# Patient Record
Sex: Male | Born: 1960 | Race: White | Hispanic: No | Marital: Married | Smoking: Never smoker
Health system: Southern US, Academic
[De-identification: ages and names within clinical notes are randomized; demographics above are authoritative.]

## PROBLEM LIST (undated history)

## (undated) DIAGNOSIS — J45909 Unspecified asthma, uncomplicated: Secondary | ICD-10-CM

## (undated) DIAGNOSIS — Z9289 Personal history of other medical treatment: Secondary | ICD-10-CM

## (undated) DIAGNOSIS — I10 Essential (primary) hypertension: Secondary | ICD-10-CM

## (undated) DIAGNOSIS — E119 Type 2 diabetes mellitus without complications: Secondary | ICD-10-CM

## (undated) HISTORY — PX: HX COLONOSCOPY: 2100001147

## (undated) HISTORY — PX: HX BACK SURGERY: SHX140

## (undated) HISTORY — PX: RENAL BIOPSY, OPEN: SUR143

## (undated) HISTORY — PX: LIMBAL STEM CELL TRANSPLANT: SHX1969

## (undated) HISTORY — DX: Essential (primary) hypertension: I10

## (undated) HISTORY — DX: Unspecified asthma, uncomplicated: J45.909

## (undated) HISTORY — DX: Type 2 diabetes mellitus without complications: E11.9

## (undated) HISTORY — DX: Personal history of other medical treatment: Z92.89

---

## 1995-11-14 ENCOUNTER — Emergency Department (HOSPITAL_COMMUNITY): Payer: Self-pay

## 2019-10-26 IMAGING — MR MRI LUMBAR SPINE WITHOUT CONTRAST
6 series · 48 of 48 positions shown · IV contrast (gadolinium)
Comparison: None.

EXAM:  MRI LUMBAR SPINE WITHOUT CONTRAST
INDICATION: Pain in lower back.
TECHNIQUE: Multiplanar, multisequential MRI of the lumbar spine was performed without gadolinium contrast.

[Series 6: T2 · sagittal · 5.0mm · 1.00mm/px · 7 of 15 slices shown (1 of 3)]
[im 1/15]
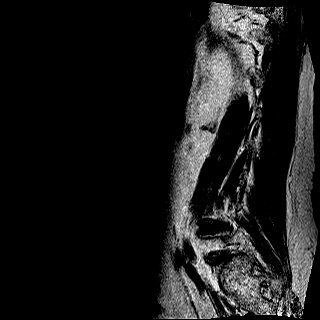
[im 3/15]
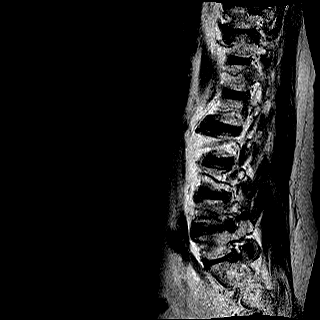
[im 5/15]
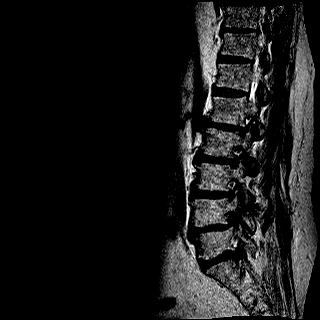
[im 8/15]
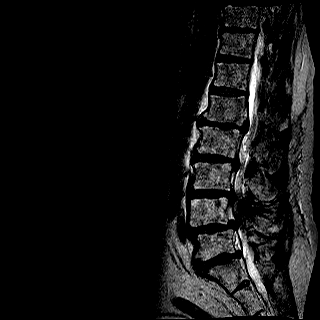
[im 10/15]
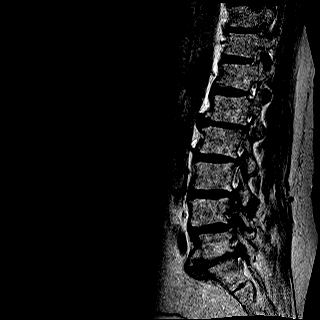
[im 12/15]
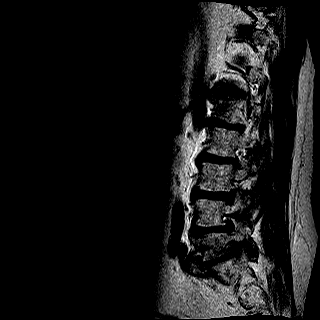
[im 15/15]
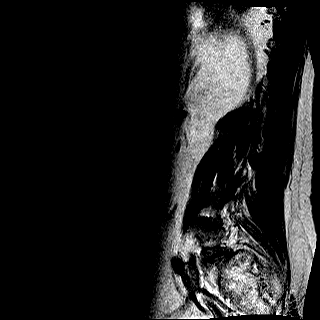

[Series 7: T1 · sagittal · 5.0mm · 1.00mm/px · 7 of 15 slices shown (1 of 2)]
[im 1/15]
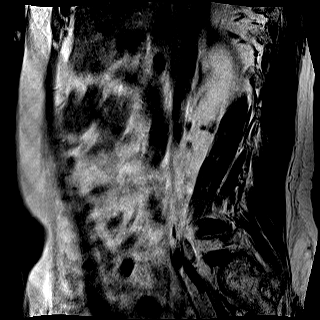
[im 3/15]
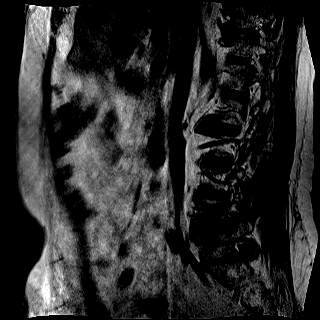
[im 5/15]
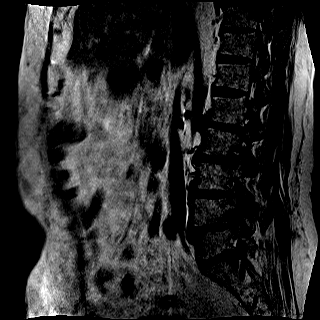
[im 8/15]
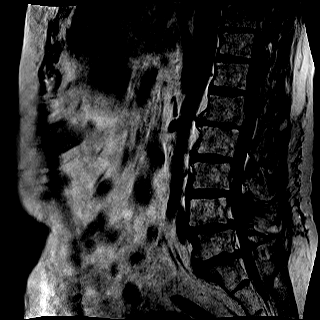
[im 10/15]
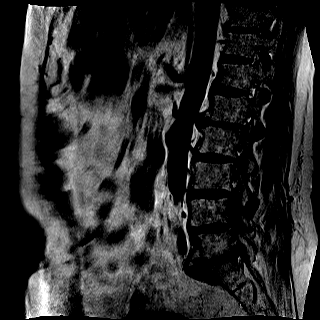
[im 12/15]
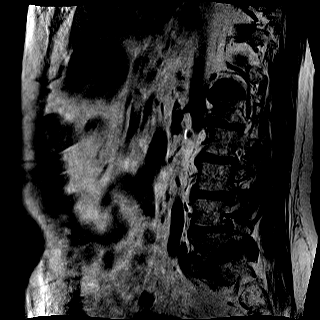
[im 15/15]
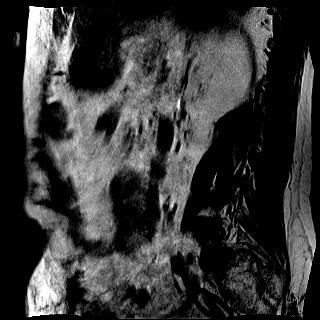

[Series 10: STIR · sagittal · 5.0mm · 1.25mm/px · 6 of 15 slices shown]
[im 1/15]
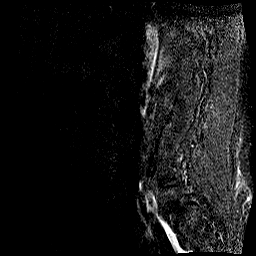
[im 3/15]
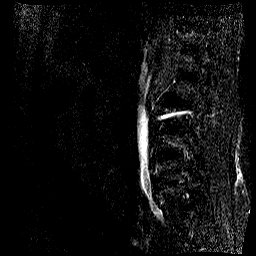
[im 6/15]
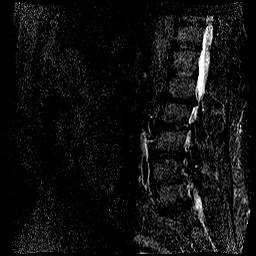
[im 9/15]
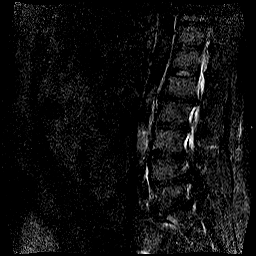
[im 12/15]
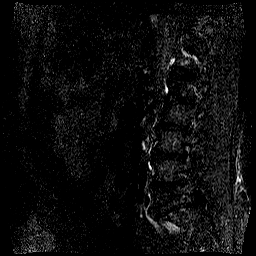
[im 15/15]
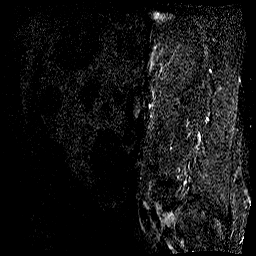

[Series 11: T2 · axial · 5.0mm · 0.89mm/px · z∈[-124,+61]mm · 10 of 25 slices shown (2 of 3)]
[im 1/25]
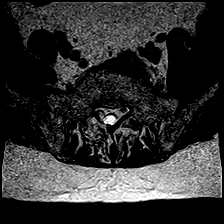
[im 3/25]
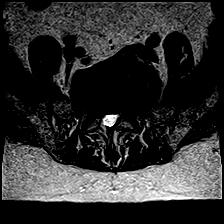
[im 6/25]
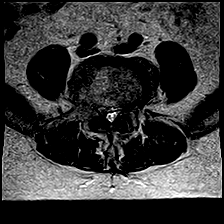
[im 9/25]
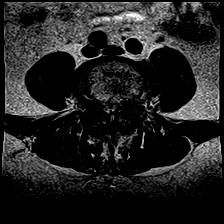
[im 11/25]
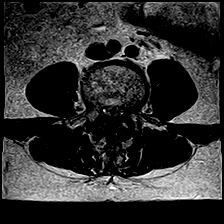
[im 14/25]
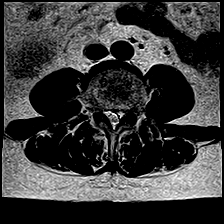
[im 17/25]
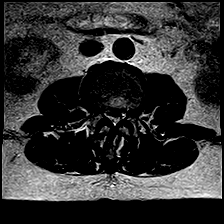
[im 19/25]
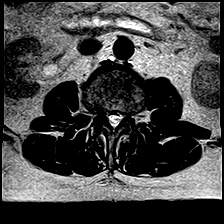
[im 22/25]
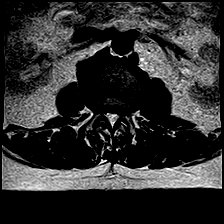
[im 25/25]
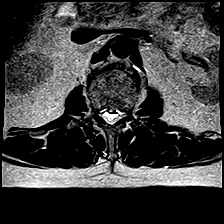

[Series 12: T1 · axial · 5.0mm · 0.89mm/px · z∈[-124,+61]mm · 10 of 25 slices shown (2 of 2)]
[im 1/25]
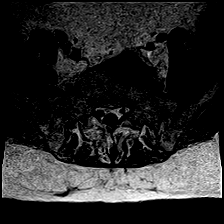
[im 3/25]
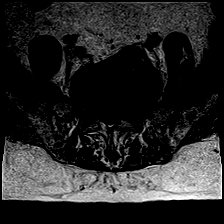
[im 6/25]
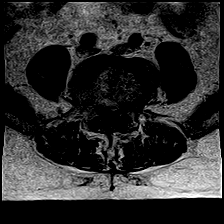
[im 9/25]
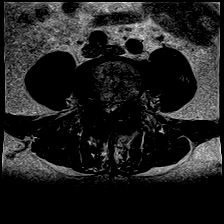
[im 11/25]
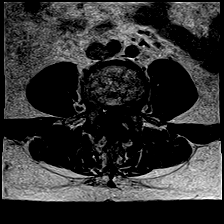
[im 14/25]
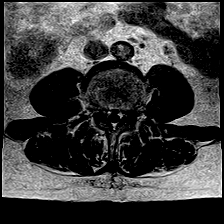
[im 17/25]
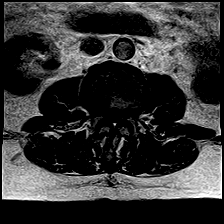
[im 19/25]
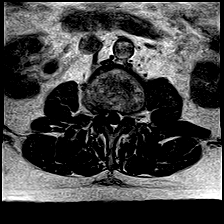
[im 22/25]
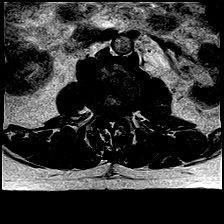
[im 25/25]
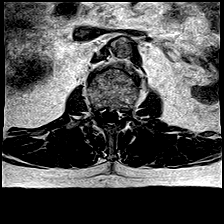

[Series 14: T2 · coronal · 4.0mm · 1.34mm/px · 8 of 20 slices shown (3 of 3)]
[im 1/20]
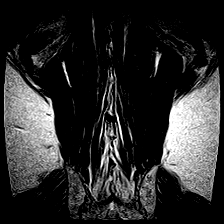
[im 3/20]
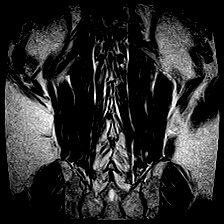
[im 6/20]
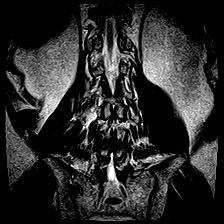
[im 9/20]
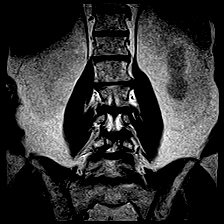
[im 11/20]
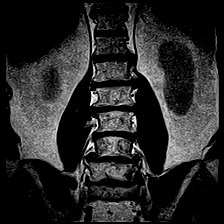
[im 14/20]
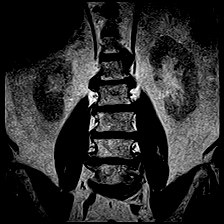
[im 17/20]
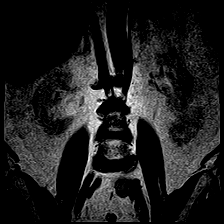
[im 20/20]
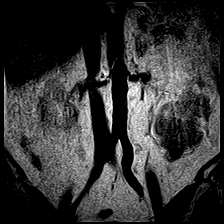

[48 of 48 positions shown; findings below may reference images not displayed]

FINDINGS: There is no acute bone marrow edema.  No compression fractures are seen.  No subluxation noted.  Conus terminates at T12-L1.  

At L1-L2, broad-based disc osteophyte complex noted causing severe central canal and bilateral foraminal stenosis.  

At L2-L3, significant broad-based disc osteophyte complex and ligamentum flavum hypertrophy noted causing severe central canal and bilateral foraminal stenosis, AP diameter of 4 mm.  

At L3-L4, significant broad-based disc osteophyte complex and ligamentum flavum hypertrophy noted causing critical 3 mm central canal stenosis and severe bilateral foraminal stenosis. 

At L4-L5, broad-based disc osteophyte complex noted causing critically severe central and bilateral foraminal stenosis with AP diameter of 3 mm.  

At L5-S1, broad-based disc osteophyte complex noted causing severe central canal and bilateral foraminal stenosis.  

Paravertebral soft tissues otherwise unremarkable.
IMPRESSION: No acute bone marrow edema, compression fractures, or subluxation.

Severe multilevel critical central canal stenosis secondary to multilevel disc osteophyte complexes as described above, worst at L3-L4 and L4-L5.

## 2021-09-10 ENCOUNTER — Ambulatory Visit (INDEPENDENT_AMBULATORY_CARE_PROVIDER_SITE_OTHER): Payer: Medicare Other | Admitting: Hematology & Oncology

## 2021-09-10 ENCOUNTER — Encounter (INDEPENDENT_AMBULATORY_CARE_PROVIDER_SITE_OTHER): Payer: Self-pay | Admitting: Hematology & Oncology

## 2021-09-10 ENCOUNTER — Other Ambulatory Visit: Payer: Self-pay

## 2021-09-10 VITALS — BP 147/79 | HR 93 | Temp 98.8°F | Ht 71.0 in | Wt 240.0 lb

## 2021-09-10 DIAGNOSIS — C9000 Multiple myeloma not having achieved remission: Secondary | ICD-10-CM

## 2021-09-10 NOTE — Progress Notes (Signed)
Department of Hematology/Oncology  History and Physical    Name: Connor Patterson  ZYY:[id number]  Date of Birth: 1961/07/27  Encounter Date: 09/10/2021    REFERRING PROVIDER:  Loran Senters, MD  [address]  [place],  [place] [id number]    REASON FOR OFFICE VISIT:  New patient for evaluation and management of  Multiple myeloma.    HISTORY OF PRESENT ILLNESS:  Connor Patterson is a 60 y.o. male who presents today for multiple myeloma.diagnosred  January 2004,,patient went to the emergency room at that time with respiratory infection and found to have acute renal failure with creatinine of 4.1 ,serum protein electrophoresis was showing 0.1 g monoclonal protein ,urine protein electrophoresis revealed monoclonal free lambda light chain ,total protein 3.1 g /24 hours, bone marrow aspiration biopsy was consistent with plasma cell neoplasm with normal cytogenetics ,skeletal survey was negative for lytic lesions s,erum calcium was 9.4 ,beta 2 microglobulin was elevated at 6 ,quantitative immunoglobulin was abnormal with IgG 365 IgA 14 , patient got treatment with  VAD treatment for 4 cycles then 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 using Cytoxan 1.5 grams/meters sq every 3 hours x3 doses and preparative  regimen was melphalan 100 mg per m2 for two doses,.and post treatment course day +60 posttransplant evaluation on 19 September 2003- bone marrow biopsy aspiration showed normal cellular marrow with no evidence of multiple myeloma no M spike and the patient was put on PO Thalidomide,Patient was  found to have renal artery stenosis and had exploratory laparotomy with bilateral renal vein aortorenal  bypass with reverse saphenous vein for severe hypertension and renal insufficiency ,Thalidomide was  discontinued along with Aredia followed without any myeloma, Osteonecrosis of jaw diagnosed 2013 , posttransplant treatment - Thalidomide 50 mg daily until October 2010 ,surveillance   October 2010 till October 2016, Revlimid 15 mg day 1-21 each 28 day ,cycle began October 2016 due to slowly rising free light chain ,Revlimid was held 2017 due to poor tolerance ,resumed at 5 mg and it was discontinued October 2017 for poor tolerance and serological studies showed persistent remission at that time,Ixazomib  was used for maintenance therapy while in remission,started on 12 thy  March 2018,while in remission ,continued with no evidence of recurrence or relapse disease, a restaging January 2021 -myeloma laboratories studies showed persistent remission ,January,27 th, 2021 ,marrow showed 5-10% polytypic plasma cells  and Fish -no myeloma associated abnormality detected ,MRD negative, PET-CT February 2021-no official interpretation ,and Left mandible lesions consistent with osteonecrosis.  Since his last visit patient was diagnosed with TIA in late 2021 and was on statin stopped after shoulder pain, patient also got the COVID May 10, 2021 and was put on Malnupiravir,    ROS:  Asthma,hypertension diabetes multiple myeloma, history of transfusion.,  Review of Systems - Oncology ,,    History:  Past Medical History:   Diagnosis Date   . Asthma    . Diabetes mellitus, type 2 (CMS HCC)    . Essential hypertension    . Hx of transfusion            Past Surgical History:   Procedure Laterality Date   . HX BACK SURGERY      3 times   . HX COLONOSCOPY     . LIMBAL STEM CELL TRANSPLANT     . RENAL BIOPSY, OPEN             Social History     Socioeconomic History   .  Marital status: Married     Spouse name: Not on file   . Number of children: Not on file   . Years of education: Not on file   . Highest education level: Not on file   Occupational History   . Not on file   Tobacco Use   . Smoking status: Never   . Smokeless tobacco: Former   Media planner   . Vaping Use: Never used   Substance and Sexual Activity   . Alcohol use: Not on file   . Drug use: Never   . Sexual activity: Not on file   Other Topics Concern   .  Not on file   Social History Narrative   . Not on file     Social Determinants of Health     Financial Resource Strain: Not on file   Food Insecurity: Not on file   Transportation Needs: Not on file   Physical Activity: Not on file   Stress: Not on file   Intimate Partner Violence: Not on file   Housing Stability: Not on file       Social History     Social History Narrative   . Not on file       Social History     Substance and Sexual Activity   Drug Use Never       Family Medical History:     Problem Relation (Age of Onset)    Autoimmune disease Mother    Carotid Stenosis Mother    Hodgkin's lymphoma Father            Current Outpatient Medications   Medication Sig   . albuterol sulfate (PROVENTIL) 2.5 mg /3 mL (0.083 %) Inhalation nebulizer solution Take 3 mL (2.5 mg total) by inhalation   . allopurinoL (ZYLOPRIM) 300 mg Oral Tablet    . amoxicillin (AMOXIL) 500 mg Oral Capsule Take 1 capsule by mouth three times daily for 10 days as needed for infection of jaw cavity.   Marland Kitchen ascorbic acid, vitamin C, (VITAMIN C) 500 mg Oral Tablet Take 1 Tablet (500 mg total) by mouth   . aspirin (ECOTRIN) 325 mg Oral Tablet, Delayed Release (E.C.) Take 1 Tablet (325 mg total) by mouth   . BREO ELLIPTA 100-25 mcg/dose Inhalation Disk with Device Take 1 INHALATION by inhalation Once a day   . chlorhexidine gluconate (PERIDEX) 0.12 % Mucous Membrane Mouthwash 15 mL   . chlorTHALIDONE (HYGROTON) 25 mg Oral Tablet Take 1 Tablet (25 mg total) by mouth Once a day   . cyanocobalamin (VITAMIN B 12) 1,000 mcg Oral Tablet Take 1 Tablet (1,000 mcg total) by mouth   . cyclobenzaprine (FLEXERIL) 5 mg Oral Tablet Take 1 Tablet (5 mg total) by mouth   . famotidine (PEPCID) 20 mg Oral Tablet Take 1 Tablet (20 mg total) by mouth   . furosemide (LASIX) 20 mg Oral Tablet Take 1 Tablet (20 mg total) by mouth   . glipiZIDE (GLUCOTROL) 10 mg Oral Tablet Take 1 Tablet (10 mg total) by mouth Once a day   . L.acid/L.casei/B.bif/B.lon/FOS (PROBIOTIC BLEND  ORAL) Take 1 Capsule by mouth   . levothyroxine (SYNTHROID) 100 mcg Oral Tablet Take 1 Tablet (100 mcg total) by mouth   . magnesium oxide (MAG-OX) 400 mg Oral Tablet Take 1 Tablet (400 mg total) by mouth   . mometasone (NASONEX) 50 mcg/actuation Nasal Spray, Non-Aerosol 1 Spray   . nystatin (MYCOSTATIN) 100,000 unit/mL Oral Suspension    . ondansetron (  ZOFRAN) 8 mg Oral Tablet Take 1 Tablet (8 mg total) by mouth   . pentoxifylline (TRENTAL) 400 mg Oral Tablet Sustained Release Take 1 Tablet (400 mg total) by mouth   . polyethylene glycol (MIRALAX) 17 gram/dose Oral Powder Take 3 teaspoons (17 g total) by mouth   . potassium chloride (K-DUR) 20 mEq Oral Tab Sust.Rel. Particle/Crystal Take 1 Tablet (20 mEq total) by mouth   . SALIVA STIMULANT COMB. NO.7 MM Rinse mouth daily as needed for dry mouth   . semaglutide 3 mg Oral Tablet Take 1 Tablet (3 mg total) by mouth   . spironolactone (ALDACTONE) 100 mg Oral Tablet    . triamcinolone acetonide (ARISTOCORT A) 0.1 % Cream Apply topically   . vitamin E 400 unit Oral Capsule Take 1 Capsule (400 Units total) by mouth       No Known Allergies      PHYSICAL EXAM:  Most Recent Vitals    Flowsheet Row Office Visit from 09/10/2021 in Hematology/Oncology,   [facility]   Temperature 37.1 C (98.8 F) filed at... 09/10/2021 1009   Heart Rate 93 filed at... 09/10/2021 1009   BP (Non-Invasive) 147/79 filed at... 09/10/2021 1009   Height 1.803 m (5' 11" ) filed at... 09/10/2021 1009   Weight 109 kg (240 lb) filed at... 09/10/2021 1009   BMI (Calculated) 33.54 filed at... 09/10/2021 1009   BSA (Calculated) 2.34 filed at... 09/10/2021 1009       Physical Exam  Constitutional:       Appearance: Normal appearance.   HENT:      Head: Normocephalic and atraumatic.      Nose: Nose normal.   Eyes:      Extraocular Movements: Extraocular movements intact.      Pupils: Pupils are equal, round, and reactive to light.   Cardiovascular:      Rate and Rhythm: Normal rate and  regular rhythm.      Pulses: Normal pulses.      Heart sounds: Normal heart sounds.   Pulmonary:      Effort: Pulmonary effort is normal.      Breath sounds: Normal breath sounds.   Abdominal:      General: Abdomen is flat.      Palpations: Abdomen is soft.   Musculoskeletal:         General: Normal range of motion.      Cervical back: Normal range of motion and neck supple.   Skin:     General: Skin is warm.   Neurological:      General: No focal deficit present.      Mental Status: He is alert and oriented to person, place, and time.   Psychiatric:         Mood and Affect: Mood normal.         Behavior: Behavior normal.            ASSESSMENT:    ICD-10-CM    1. Multiple myeloma, remission status unspecified (CMS HCC)  C90.00 CBC/DIFF     COMPREHENSIVE METABOLIC PANEL, NON-FASTING     KAPPA AND LAMBDA FREE LIGHT CHAINS, SERUM     ELECTROPHORESIS, PROTEIN, SERUM     HGA1C (HEMOGLOBIN A1C WITH EST AVG GLUCOSE)             PLAN:   1. All relative external and internal medical records were reviewed including available H&Ps, progress notes, procedure notes, imaging's, laboratories, and pathology.   2. All labs from today were  reviewed with the patient including  details of exam finding's discussed.   We will check serum protein electrophoresis ,serum light chain assay ,CBC ,LDH ,beta 2 microglobulin ,CMP ,hemoglobin A1c return to  office in 2 months.      Polly Cobia was given the chance to ask questions, and these were answered to their satisfaction. The patient is welcome to call with any questions or concerns in the meantime.     On the day of the encounter, a total of  30 minutes was spent on this patient encounter including review of historical information, examination, documentation and post-visit activities.   Return in about 2 weeks (around 09/24/2021).     Livia Snellen, MD  09/10/2021, 18:02    CC:  [facility]  [place]  [phone]    Loran Senters, [address]  [place],  [place] [phone]      This note was partially generated using MModal Fluency Direct system, and there may be some incorrect words, spellings, and punctuation that were not noted in checking the note before saving.

## 2021-11-14 ENCOUNTER — Other Ambulatory Visit: Payer: Self-pay

## 2021-11-14 ENCOUNTER — Encounter (INDEPENDENT_AMBULATORY_CARE_PROVIDER_SITE_OTHER): Payer: Self-pay | Admitting: Hematology & Oncology

## 2021-11-14 ENCOUNTER — Ambulatory Visit (INDEPENDENT_AMBULATORY_CARE_PROVIDER_SITE_OTHER): Payer: Medicare Other | Admitting: Hematology & Oncology

## 2021-11-14 VITALS — BP 153/77 | HR 103 | Temp 96.5°F | Ht 70.0 in | Wt 234.7 lb

## 2021-11-14 DIAGNOSIS — C9000 Multiple myeloma not having achieved remission: Secondary | ICD-10-CM

## 2021-11-14 NOTE — Progress Notes (Signed)
Department of Hematology/Oncology  History and Physical    Name: Connor Patterson  BPZ:[id number]  Date of Birth: October 18, 1961  Encounter Date: 11/14/2021    REFERRING PROVIDER:  Loran Senters, MD  No address on file    REASON FOR OFFICE VISIT:  patient for evaluation and management of multip[le myeloma    HISTORY OF PRESENT ILLNESS:HISTORY OF PRESENT ILLNESS:  Connor Patterson is a 60 y.o. male who presents today for multiple myeloma.diagnosred  January 2004,,patient went to the emergency room at that time with respiratory infection and found to have acute renal failure with creatinine of 4.1 ,serum protein electrophoresis was showing 0.1 g monoclonal protein ,urine protein electrophoresis revealed monoclonal free lambda light chain ,total protein 3.1 g /24 hours, bone marrow aspiration biopsy was consistent with plasma cell neoplasm with normal cytogenetics ,skeletal survey was negative for lytic lesions s,erum calcium was 9.4 ,beta 2 microglobulin was elevated at 6 ,quantitative immunoglobulin was abnormal with IgG 365 IgA 14 , patient got treatment with  VAD treatment for 4 cycles then 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 using Cytoxan 1.5 grams/meters sq every 3 hours x3 doses and preparative  regimen was melphalan 100 mg per m2 for two doses,.and post treatment course day +60 posttransplant evaluation on 19 September 2003- bone marrow biopsy aspiration showed normal cellular marrow with no evidence of multiple myeloma no M spike and the patient was put on PO Thalidomide,Patient was  found to have renal artery stenosis and had exploratory laparotomy with bilateral renal vein aortorenal  bypass with reverse saphenous vein for severe hypertension and renal insufficiency ,Thalidomide was  discontinued along with Aredia followed without any myeloma, Osteonecrosis of jaw diagnosed 2013 , posttransplant treatment - Thalidomide 50 mg daily until October 2010 ,surveillance  October 2010  till October 2016, Revlimid 15 mg day 1-21 each 28 day ,cycle began October 2016 due to slowly rising free light chain ,Revlimid was held 2017 due to poor tolerance ,resumed at 5 mg and it was discontinued October 2017 for poor tolerance and serological studies showed persistent remission at that time,Ixazomib  was used for maintenance therapy while in remission,started on 12 thy  March 2018,while in remission ,continued with no evidence of recurrence or relapse disease, a restaging January 2021 -myeloma laboratories studies showed persistent remission ,January,27 th, 2021 ,marrow showed 5-10% polytypic plasma cells  and Fish -no myeloma associated abnormality detected ,MRD negative, PET-CT February 2021-no official interpretation ,and Left mandible lesions consistent with osteonecrosis.  Since his last visit patient was diagnosed with TIA in late 2021 and was on statin stopped after shoulder pain, patient also got the COVID May 10, 2021 and was put on Malnupiravir, blood work on 10th of October sodium 135 potassium 4, hemoglobin A1c 6.8 WBC 10.4 hemoglobin 14.6 hematocrit 43.1 platelet 245 SPEP- no M protein, free light chain -kappa 26.3 lambda 29.8 ratio 0.88    ROS:  Asthma,hypertension diabetes multiple myeloma, history of transfusion.,    History:  Past Medical History:   Diagnosis Date   . Asthma    . Diabetes mellitus, type 2 (CMS HCC)    . Essential hypertension    . Hx of transfusion            Past Surgical History:   Procedure Laterality Date   . HX BACK SURGERY      3 times   . HX COLONOSCOPY     . LIMBAL STEM CELL TRANSPLANT     .  RENAL BIOPSY, OPEN             Social History     Socioeconomic History   . Marital status: Married     Spouse name: Not on file   . Number of children: Not on file   . Years of education: Not on file   . Highest education level: Not on file   Occupational History   . Not on file   Tobacco Use   . Smoking status: Never   . Smokeless tobacco: Former   Media planner   . Vaping Use:  Never used   Substance and Sexual Activity   . Alcohol use: Not on file   . Drug use: Never   . Sexual activity: Not on file   Other Topics Concern   . Not on file   Social History Narrative   . Not on file     Social Determinants of Health     Financial Resource Strain: Not on file   Food Insecurity: Not on file   Transportation Needs: Not on file   Physical Activity: Not on file   Stress: Not on file   Intimate Partner Violence: Not on file   Housing Stability: Not on file       Social History     Social History Narrative   . Not on file       Social History     Substance and Sexual Activity   Drug Use Never       Family Medical History:     Problem Relation (Age of Onset)    Autoimmune disease Mother    Carotid Stenosis Mother    Hodgkin's lymphoma Father            Current Outpatient Medications   Medication Sig   . albuterol sulfate (PROVENTIL) 2.5 mg /3 mL (0.083 %) Inhalation nebulizer solution Take 3 mL (2.5 mg total) by inhalation   . allopurinoL (ZYLOPRIM) 300 mg Oral Tablet    . amoxicillin (AMOXIL) 500 mg Oral Capsule Take 1 capsule by mouth three times daily for 10 days as needed for infection of jaw cavity.   Marland Kitchen ascorbic acid, vitamin C, (VITAMIN C) 500 mg Oral Tablet Take 1 Tablet (500 mg total) by mouth   . aspirin (ECOTRIN) 325 mg Oral Tablet, Delayed Release (E.C.) Take 1 Tablet (325 mg total) by mouth   . BREO ELLIPTA 100-25 mcg/dose Inhalation Disk with Device Take 1 INHALATION by inhalation Once a day   . chlorhexidine gluconate (PERIDEX) 0.12 % Mucous Membrane Mouthwash 15 mL   . chlorTHALIDONE (HYGROTON) 25 mg Oral Tablet Take 1 Tablet (25 mg total) by mouth Once a day   . cyanocobalamin (VITAMIN B 12) 1,000 mcg Oral Tablet Take 1 Tablet (1,000 mcg total) by mouth   . cyclobenzaprine (FLEXERIL) 10 mg Oral Tablet Take 1 Tablet (10 mg total) by mouth Every night   . famotidine (PEPCID) 20 mg Oral Tablet Take 1 Tablet (20 mg total) by mouth   . furosemide (LASIX) 20 mg Oral Tablet Take 1 Tablet  (20 mg total) by mouth   . glipiZIDE (GLUCOTROL) 10 mg Oral Tablet Take 1 Tablet (10 mg total) by mouth Once a day   . L.acid/L.casei/B.bif/B.lon/FOS (PROBIOTIC BLEND ORAL) Take 1 Capsule by mouth   . levothyroxine (SYNTHROID) 100 mcg Oral Tablet Take 1 Tablet (100 mcg total) by mouth   . magnesium oxide (MAG-OX) 400 mg Oral Tablet Take 1 Tablet (400 mg total)  by mouth   . mometasone (NASONEX) 50 mcg/actuation Nasal Spray, Non-Aerosol 1 Spray   . nystatin (MYCOSTATIN) 100,000 unit/mL Oral Suspension    . ondansetron (ZOFRAN) 8 mg Oral Tablet Take 1 Tablet (8 mg total) by mouth   . pentoxifylline (TRENTAL) 400 mg Oral Tablet Sustained Release Take 1 Tablet (400 mg total) by mouth   . polyethylene glycol (MIRALAX) 17 gram/dose Oral Powder Take 3 teaspoons (17 g total) by mouth   . potassium chloride (K-DUR) 20 mEq Oral Tab Sust.Rel. Particle/Crystal Take 1 Tablet (20 mEq total) by mouth   . SALIVA STIMULANT COMB. NO.7 MM Rinse mouth daily as needed for dry mouth   . semaglutide 3 mg Oral Tablet Take 1 Tablet (3 mg total) by mouth   . spironolactone (ALDACTONE) 100 mg Oral Tablet    . triamcinolone acetonide (ARISTOCORT A) 0.1 % Cream Apply topically   . tumeric-ging-olive-oreg-capryl 100 mg-150 mg- 50 mg-150 mg Oral Capsule Take by mouth 500 mg   . vitamin E 400 unit Oral Capsule Take 1 Capsule (400 Units total) by mouth       No Known Allergies      PHYSICAL EXAM:  Most Recent Vitals    Flowsheet Row Office Visit from 11/14/2021 in Hematology/Oncology,   [facility]   Temperature 35.8 C (96.5 F) filed at... 11/14/2021 1027   Heart Rate 103 filed at... 11/14/2021 1027   BP (Non-Invasive) 153/77 filed at... 11/14/2021 1027   Height 1.778 m (5' 10" ) filed at... 11/14/2021 1027   Weight 106 kg (234 lb 11.2 oz) filed at... 11/14/2021 1027   BMI (Calculated) 33.75 filed at... 11/14/2021 1027   BSA (Calculated) 2.29 filed at... 11/14/2021 1027        Physical Exam  Constitutional:       Appearance: Normal  appearance.   HENT:      Head: Normocephalic and atraumatic.      Nose: Nose normal.   Eyes:      Extraocular Movements: Extraocular movements intact.      Pupils: Pupils are equal, round, and reactive to light.   Cardiovascular:      Rate and Rhythm: Normal rate and regular rhythm.      Pulses: Normal pulses.      Heart sounds: Normal heart sounds.   Pulmonary:      Effort: Pulmonary effort is normal.      Breath sounds: Normal breath sounds.   Abdominal:      General: Abdomen is flat.      Palpations: Abdomen is soft.   Musculoskeletal:         General: Normal range of motion.      Cervical back: Normal range of motion and neck supple.   Skin:     General: Skin is warm.   Neurological:      General: No focal deficit present.      Mental Status: He is alert and oriented to person, place, and time.   Psychiatric:         Mood and Affect: Mood normal.         Behavior: Behavior normal.                ASSESSMENT:    PLAN:   1. All relative external and internal medical records were reviewed including available H&Ps, progress notes, procedure notes, imaging's, laboratories, and pathology.   2. All labs from today were reviewed with the patient including  details of exam finding's discussed.  We will check CBC ,CMP, beta 2 M ,LDH, SPEP, light chain assay 3 weeks, follow-up in 4  weeks.  Polly Cobia was given the chance to ask questions, and these were answered to their satisfaction. The patient is welcome to call with any questions or concerns in the meantime.     On the day of the encounter, a total of  30 minutes was spent on this patient encounter including review of historical information, examination, documentation and post-visit activities.   No follow-ups on file.     Livia Snellen, MD  11/14/2021, 12:26    CC:  [facility] ([facility])  [place] [id number]    Loran Senters, MD  No address on file      This note was partially generated using  MModal Fluency Direct system, and there may be some incorrect words, spellings, and punctuation that were not noted in checking the note before saving.

## 2021-12-13 ENCOUNTER — Other Ambulatory Visit: Payer: Self-pay

## 2021-12-13 ENCOUNTER — Ambulatory Visit (INDEPENDENT_AMBULATORY_CARE_PROVIDER_SITE_OTHER): Payer: Medicare Other | Admitting: Hematology & Oncology

## 2021-12-13 ENCOUNTER — Encounter (INDEPENDENT_AMBULATORY_CARE_PROVIDER_SITE_OTHER): Payer: Self-pay | Admitting: Hematology & Oncology

## 2021-12-13 VITALS — BP 152/79 | HR 84 | Temp 96.4°F | Ht 71.0 in | Wt 248.4 lb

## 2021-12-13 DIAGNOSIS — C9000 Multiple myeloma not having achieved remission: Secondary | ICD-10-CM

## 2021-12-13 NOTE — Progress Notes (Signed)
Department of Hematology/Oncology  History and Physical    Name: Connor Patterson  CBS:[id number]  Date of Birth: 11/23/1961  Encounter Date: 12/13/2021    REASON FOR OFFICE VISIT:   patient for evaluation and management of  multiple myeloma.Marland Kitchen    HISTORY OF PRESENT ILLNESS:  Connor Patterson is a 61 y.o. male who presents today for multiple myeloma.diagnosred  January 2004,,patient went to the emergency room at that time with respiratory infection and found to have acute renal failure with creatinine of 4.1 ,serum protein electrophoresis was showing 0.1 g monoclonal protein ,urine protein electrophoresis revealed monoclonal free lambda light chain ,total protein 3.1 g /24 hours, bone marrow aspiration biopsy was consistent with plasma cell neoplasm with normal cytogenetics ,skeletal survey was negative for lytic lesions s,erum calcium was 9.4 ,beta 2 microglobulin was elevated at 6 ,quantitative immunoglobulin was abnormal with IgG 365 IgA 14 , patient got treatment with  VAD treatment for 4 cycles then 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 using Cytoxan 1.5 grams/meters sq every 3 hours x3 doses and preparative  regimen was melphalan 100 mg per m2 for two doses,.and post treatment course day +60 posttransplant evaluation on 19 September 2003- bone marrow biopsy aspiration showed normal cellular marrow with no evidence of multiple myeloma no M spike and the patient was put on PO Thalidomide,Patient was  found to have renal artery stenosis and had exploratory laparotomy with bilateral renal vein aortorenal  bypass with reverse saphenous vein for severe hypertension and renal insufficiency ,Thalidomide was  discontinued along with Aredia followed without any myeloma, Osteonecrosis of jaw diagnosed 2013 , posttransplant treatment - Thalidomide 50 mg daily until October 2010 ,surveillance  October 2010 till October 2016, Revlimid 15 mg day 1-21 each 28 day ,cycle began October 2016 due  to slowly rising free light chain ,Revlimid was held 2017 due to poor tolerance ,resumed at 5 mg and it was discontinued October 2017 for poor tolerance and serological studies showed persistent remission at that time,Ixazomib  was used for maintenance therapy while in remission,started on 12 thy  March 2018,while in remission ,continued with no evidence of recurrence or relapse disease, a restaging January 2021 -myeloma laboratories studies showed persistent remission ,January,27 th, 2021 ,marrow showed 5-10% polytypic plasma cells  and Fish -no myeloma associated abnormality detected ,MRD negative, PET-CT February 2021-no official interpretation ,and Left mandible lesions consistent with osteonecrosis.  Since his last visit patient was diagnosed with TIA in late 2021 and was on statin stopped after shoulder pain, patient also got the COVID May 10, 2021 and was put on Malnupiravir, blood work on 10th of October sodium 135 potassium 4, hemoglobin A1c 6.8 WBC 10.4 hemoglobin 14.6 hematocrit 43.1 platelet 245 SPEP- no M protein, free light chain -kappa 26.3 lambda 29.8 ratio 0.88   WBC 6.6,Hemoglobin 14, sodium 133 potassium 4.41128 GFR more than 59 BUN 25 creatinine 1.2 bilirubin 0.6 AST 21 ALT 29 alkaline phosphatase 117,hematocrit 41.1 platelets 237 serum protein electrophoresis -no monoclonal protein ,serum light chain assay ratio -1.06 normal,,patient has history  of shoulder pain bilaterally left knee pain ankles for 1 year, without any history of trauma      ROS:  Asthma,hypertension diabetes multiple myeloma, history of transfusion.,      History:    Past Medical History:   Diagnosis Date   . Asthma    . Diabetes mellitus, type 2 (CMS HCC)    . Essential hypertension    . Hx of  transfusion         Past Surgical History:   Procedure Laterality Date   . HX BACK SURGERY      3 times   . HX COLONOSCOPY     . LIMBAL STEM CELL TRANSPLANT     . RENAL BIOPSY, OPEN             Social History     Socioeconomic History    . Marital status: Married     Spouse name: Not on file   . Number of children: Not on file   . Years of education: Not on file   . Highest education level: Not on file   Occupational History   . Not on file   Tobacco Use   . Smoking status: Never   . Smokeless tobacco: Former   Media planner   . Vaping Use: Never used   Substance and Sexual Activity   . Alcohol use: Not on file   . Drug use: Never   . Sexual activity: Not on file   Other Topics Concern   . Not on file   Social History Narrative   . Not on file     Social Determinants of Health     Financial Resource Strain: Not on file   Food Insecurity: Not on file   Transportation Needs: Not on file   Physical Activity: Not on file   Stress: Not on file   Intimate Partner Violence: Not on file   Housing Stability: Not on file       Social History     Social History Narrative   . Not on file       Social History     Substance and Sexual Activity   Drug Use Never       Family Medical History:     Problem Relation (Age of Onset)    Autoimmune disease Mother    Carotid Stenosis Mother    Hodgkin's lymphoma Father            Current Outpatient Medications   Medication Sig   . albuterol sulfate (PROVENTIL) 2.5 mg /3 mL (0.083 %) Inhalation nebulizer solution Take 3 mL (2.5 mg total) by inhalation   . allopurinoL (ZYLOPRIM) 300 mg Oral Tablet    . amoxicillin (AMOXIL) 500 mg Oral Capsule Take 1 capsule by mouth three times daily for 10 days as needed for infection of jaw cavity.   Marland Kitchen ascorbic acid, vitamin C, (VITAMIN C) 500 mg Oral Tablet Take 1 Tablet (500 mg total) by mouth   . aspirin (ECOTRIN) 325 mg Oral Tablet, Delayed Release (E.C.) Take 1 Tablet (325 mg total) by mouth   . BREO ELLIPTA 100-25 mcg/dose Inhalation Disk with Device Take 1 INHALATION by inhalation Once a day   . chlorhexidine gluconate (PERIDEX) 0.12 % Mucous Membrane Mouthwash 15 mL   . chlorTHALIDONE (HYGROTON) 25 mg Oral Tablet Take 1 Tablet (25 mg total) by mouth Once a day   . cyanocobalamin  (VITAMIN B 12) 1,000 mcg Oral Tablet Take 1 Tablet (1,000 mcg total) by mouth   . cyclobenzaprine (FLEXERIL) 10 mg Oral Tablet Take 1 Tablet (10 mg total) by mouth Every night   . famotidine (PEPCID) 20 mg Oral Tablet Take 1 Tablet (20 mg total) by mouth   . furosemide (LASIX) 20 mg Oral Tablet Take 1 Tablet (20 mg total) by mouth   . glipiZIDE (GLUCOTROL) 10 mg Oral Tablet Take 1 Tablet (10 mg total) by  mouth Once a day   . L.acid/L.casei/B.bif/B.lon/FOS (PROBIOTIC BLEND ORAL) Take 1 Capsule by mouth   . levothyroxine (SYNTHROID) 100 mcg Oral Tablet Take 1 Tablet (100 mcg total) by mouth   . magnesium oxide (MAG-OX) 400 mg Oral Tablet Take 1 Tablet (400 mg total) by mouth   . mometasone (NASONEX) 50 mcg/actuation Nasal Spray, Non-Aerosol 1 Spray   . nystatin (MYCOSTATIN) 100,000 unit/mL Oral Suspension    . ondansetron (ZOFRAN) 8 mg Oral Tablet Take 1 Tablet (8 mg total) by mouth   . pentoxifylline (TRENTAL) 400 mg Oral Tablet Sustained Release Take 1 Tablet (400 mg total) by mouth   . polyethylene glycol (MIRALAX) 17 gram/dose Oral Powder Take 3 teaspoons (17 g total) by mouth   . potassium chloride (K-DUR) 20 mEq Oral Tab Sust.Rel. Particle/Crystal Take 1 Tablet (20 mEq total) by mouth   . SALIVA STIMULANT COMB. NO.7 MM Rinse mouth daily as needed for dry mouth   . semaglutide 3 mg Oral Tablet Take 1 Tablet (3 mg total) by mouth   . spironolactone (ALDACTONE) 100 mg Oral Tablet    . triamcinolone acetonide (ARISTOCORT A) 0.1 % Cream Apply topically   . tumeric-ging-olive-oreg-capryl 100 mg-150 mg- 50 mg-150 mg Oral Capsule Take by mouth 500 mg   . vitamin E 400 unit Oral Capsule Take 1 Capsule (400 Units total) by mouth       No Known Allergies      PHYSICAL EXAM:  Most Recent [place] Office Visit from 12/13/2021 in Hematology/Oncology,   [facility]   Temperature 35.8 C (96.4 F) filed at... 12/13/2021 0829   Heart Rate 84 filed at... 12/13/2021 0829   BP (Non-Invasive) 152/79  filed at... 12/13/2021 0829   SpO2 98 % filed at... 12/13/2021 0829   Height 1.803 m (5' 11" ) filed at... 12/13/2021 0829   Weight 113 kg (248 lb 6.4 oz) filed at... 12/13/2021 0829   BMI (Calculated) 34.72 filed at... 12/13/2021 0829   BSA (Calculated) 2.38 filed at... 12/13/2021 0829      Physical Exam  Constitutional:       Appearance: Normal appearance.   HENT:      Head: Normocephalic and atraumatic.      Nose: Nose normal.   Eyes:      Extraocular Movements: Extraocular movements intact.      Pupils: Pupils are equal, round, and reactive to light.   Cardiovascular:      Rate and Rhythm: Normal rate and regular rhythm.      Pulses: Normal pulses.      Heart sounds: Normal heart sounds.   Pulmonary:      Effort: Pulmonary effort is normal.      Breath sounds: Normal breath sounds.   Abdominal:      General: Abdomen is flat.      Palpations: Abdomen is soft.   Musculoskeletal:         General: Normal range of motion.      Cervical back: Normal range of motion and neck supple.   Skin:     General: Skin is warm.   Neurological:      General: No focal deficit present.      Mental Status: He is alert and oriented to person, place, and time.   Psychiatric:         Mood and Affect: Mood normal.         Behavior: Behavior normal.  ASSESSMENT:  Multiple ,myeloma      PLAN:   1. All relative external and internal medical records were reviewed including available H&Ps, progress notes, procedure notes, imaging's, laboratories, and pathology.   2. All labs from today were reviewed with the patient including  details of exam finding's discussed.   Patient has history of multiple myeloma with shoulder pain ,ankle pain, knee pain ,we will do skeletal survey to rule out bone lesions come back in 1 month.Polly Cobia was given the chance to ask questions, and these were answered to their satisfaction. The patient is welcome to call with any questions or concerns in the meantime.     On the day of the  encounter, a total of  30 minutes was spent on this patient encounter including review of historical information, examination, documentation and post-visit activities.   No follow-ups on file.     Livia Snellen, MD  12/13/2021, 09:48    CC:  [facility]  [place] [id number]    No referring provider defined for this encounter.      This note was partially generated using MModal Fluency Direct system, and there may be some incorrect words, spellings, and punctuation that were not noted in checking the note before saving.

## 2021-12-14 ENCOUNTER — Encounter (INDEPENDENT_AMBULATORY_CARE_PROVIDER_SITE_OTHER): Payer: Self-pay | Admitting: Hematology & Oncology

## 2021-12-14 ENCOUNTER — Encounter (INDEPENDENT_AMBULATORY_CARE_PROVIDER_SITE_OTHER): Payer: Self-pay

## 2022-01-14 ENCOUNTER — Ambulatory Visit (INDEPENDENT_AMBULATORY_CARE_PROVIDER_SITE_OTHER): Payer: Medicare Other | Admitting: Hematology & Oncology

## 2022-01-14 ENCOUNTER — Encounter (INDEPENDENT_AMBULATORY_CARE_PROVIDER_SITE_OTHER): Payer: Self-pay | Admitting: Hematology & Oncology

## 2022-01-14 ENCOUNTER — Other Ambulatory Visit: Payer: Self-pay

## 2022-01-14 VITALS — BP 156/82 | HR 79 | Temp 97.1°F | Ht 71.0 in | Wt 239.8 lb

## 2022-01-14 DIAGNOSIS — C9000 Multiple myeloma not having achieved remission: Secondary | ICD-10-CM

## 2022-01-14 NOTE — Progress Notes (Signed)
Department of Hematology/Oncology  History and Physical    Name: Connor Patterson  BHA:[id number]  Date of Birth: 1961-06-10  Encounter Date: 01/14/2022    REFERRING PROVIDER:  Casimer Lanius, DO  [address]  [place],  [place] [address]    REASON FOR OFFICE VISIT:  patient for evaluation and management of  Multiple myeloma.    HISTORY OF PRESENT ILLNESS:  Connor Patterson is a 61 y.o. male who presents today for multiple myeloma.diagnosred  January 2004,,patient went to the emergency room at that time with respiratory infection and found to have acute renal failure with creatinine of 4.1 ,serum protein electrophoresis was showing 0.1 g monoclonal protein ,urine protein electrophoresis revealed monoclonal free lambda light chain ,total protein 3.1 g /24 hours, bone marrow aspiration biopsy was consistent with plasma cell neoplasm with normal cytogenetics ,skeletal survey was negative for lytic lesions s,erum calcium was 9.4 ,beta 2 microglobulin was elevated at 6 ,quantitative immunoglobulin was abnormal with IgG 365 IgA 14 , patient got treatment with  VAD treatment for 4 cycles then 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 using Cytoxan 1.5 grams/meters sq every 3 hours x3 doses and preparative  regimen was melphalan 100 mg per m2 for two doses,.and post treatment course day +60 posttransplant evaluation on 19 September 2003- bone marrow biopsy aspiration showed normal cellular marrow with no evidence of multiple myeloma no M spike and the patient was put on PO Thalidomide,Patient was  found to have renal artery stenosis and had exploratory laparotomy with bilateral renal vein aortorenal  bypass with reverse saphenous vein for severe hypertension and renal insufficiency ,Thalidomide was  discontinued along with Aredia followed without any myeloma, Osteonecrosis of jaw diagnosed 2013 , posttransplant treatment - Thalidomide 50 mg daily until October 2010 ,surveillance   October 2010 till October 2016, Revlimid 15 mg day 1-21 each 28 day ,cycle began October 2016 due to slowly rising free light chain ,Revlimid was held 2017 due to poor tolerance ,resumed at 5 mg and it was discontinued October 2017 for poor tolerance and serological studies showed persistent remission at that time,Ixazomib  was used for maintenance therapy while in remission,started on 12 thy  March 2018,while in remission ,continued with no evidence of recurrence or relapse disease, a restaging January 2021 -myeloma laboratories studies showed persistent remission ,January,27 th, 2021 ,marrow showed 5-10% polytypic plasma cells  and Fish -no myeloma associated abnormality detected ,MRD negative, PET-CT February 2021-no official interpretation ,and Left mandible lesions consistent with osteonecrosis.  Since his last visit patient was diagnosed with TIA in late 2021 and was on statin stopped after shoulder pain, patient also got the COVID May 10, 2021 and was put on Malnupiravir, blood work on 10th of October sodium 135 potassium 4, hemoglobin A1c 6.8 WBC 10.4 hemoglobin 14.6 hematocrit 43.1 platelet 245 SPEP- no M protein, free light chain -kappa 26.3 lambda 29.8 ratio 0.88   WBC 6.6,Hemoglobin 14, sodium 133 potassium 4.41128 GFR more than 59 BUN 25 creatinine 1.2 bilirubin 0.6 AST 21 ALT 29 alkaline phosphatase 117,hematocrit 41.1 platelets 237 serum protein electrophoresis -no monoclonal protein ,serum light chain assay ratio -1.06 normal,,patient has history  of shoulder pain bilaterally left knee pain ankles for 1 year, without any history of trauma  3rd February 2023 skeleton survey was   negative for metastases multiple myeloma,multiple spinal spondylosis,Labs on 6tH February- WBC 7.8 hemoglobin 14.5 hematocrit 41.1 platelets 207,sodium 130 potassium3.8, BUN 24 creatinine 1.2    ROS:  Asthma,hypertension diabetes multiple myeloma, history of transfusion.,    History:  Past Medical History:   Diagnosis Date    . Asthma    . Diabetes mellitus, type 2 (CMS HCC)    . Essential hypertension    . Hx of transfusion      Past Surgical History:   Procedure Laterality Date   . HX BACK SURGERY      3 times   . HX COLONOSCOPY     . LIMBAL STEM CELL TRANSPLANT     . RENAL BIOPSY, OPEN           Social History     Socioeconomic History   . Marital status: Married     Spouse name: Not on file   . Number of children: Not on file   . Years of education: Not on file   . Highest education level: Not on file   Occupational History   . Not on file   Tobacco Use   . Smoking status: Never   . Smokeless tobacco: Former   Media planner   . Vaping Use: Never used   Substance and Sexual Activity   . Alcohol use: Not on file   . Drug use: Never   . Sexual activity: Not on file   Other Topics Concern   . Not on file   Social History Narrative   . Not on file     Social Determinants of Health     Financial Resource Strain: Not on file   Transportation Needs: Not on file   Social Connections: Not on file   Intimate Partner Violence: Not on file   Housing Stability: Not on file       Social History     Social History Narrative   . Not on file       Social History     Substance and Sexual Activity   Drug Use Never       Family Medical History:     Problem Relation (Age of Onset)    Autoimmune disease Mother    Carotid Stenosis Mother    Hodgkin's lymphoma Father            Current Outpatient Medications   Medication Sig   . albuterol sulfate (PROVENTIL) 2.5 mg /3 mL (0.083 %) Inhalation nebulizer solution Take 3 mL (2.5 mg total) by inhalation   . allopurinoL (ZYLOPRIM) 300 mg Oral Tablet    . amoxicillin (AMOXIL) 500 mg Oral Capsule Take 1 capsule by mouth three times daily for 10 days as needed for infection of jaw cavity.   Marland Kitchen ascorbic acid, vitamin C, (VITAMIN C) 500 mg Oral Tablet Take 1 Tablet (500 mg total) by mouth   . aspirin (ECOTRIN) 325 mg Oral Tablet, Delayed Release (E.C.) Take 1 Tablet (325 mg total) by mouth   . BREO ELLIPTA 100-25  mcg/dose Inhalation Disk with Device Take 1 INHALATION by inhalation Once a day   . chlorhexidine gluconate (PERIDEX) 0.12 % Mucous Membrane Mouthwash 15 mL   . chlorTHALIDONE (HYGROTON) 25 mg Oral Tablet Take 1 Tablet (25 mg total) by mouth Once a day   . cyanocobalamin (VITAMIN B 12) 1,000 mcg Oral Tablet Take 1 Tablet (1,000 mcg total) by mouth   . cyclobenzaprine (FLEXERIL) 10 mg Oral Tablet Take 1 Tablet (10 mg total) by mouth Every night   . famotidine (PEPCID) 20 mg Oral Tablet Take 1 Tablet (20 mg total) by mouth   . furosemide (LASIX) 20  mg Oral Tablet Take 1 Tablet (20 mg total) by mouth   . glipiZIDE (GLUCOTROL) 10 mg Oral Tablet Take 1 Tablet (10 mg total) by mouth Once a day   . L.acid/L.casei/B.bif/B.lon/FOS (PROBIOTIC BLEND ORAL) Take 1 Capsule by mouth   . levothyroxine (SYNTHROID) 100 mcg Oral Tablet Take 1 Tablet (100 mcg total) by mouth   . magnesium oxide (MAG-OX) 400 mg Oral Tablet Take 1 Tablet (400 mg total) by mouth   . mometasone (NASONEX) 50 mcg/actuation Nasal Spray, Non-Aerosol 1 Spray   . nystatin (MYCOSTATIN) 100,000 unit/mL Oral Suspension    . ondansetron (ZOFRAN) 8 mg Oral Tablet Take 1 Tablet (8 mg total) by mouth   . pentoxifylline (TRENTAL) 400 mg Oral Tablet Sustained Release Take 1 Tablet (400 mg total) by mouth   . polyethylene glycol (MIRALAX) 17 gram/dose Oral Powder Take 3 teaspoons (17 g total) by mouth   . potassium chloride (K-TAB) 20 mEq Oral Tablet Sustained Release Take by mouth Once a day   . SALIVA STIMULANT COMB. NO.7 MM Rinse mouth daily as needed for dry mouth   . semaglutide 3 mg Oral Tablet Take 1 Tablet (3 mg total) by mouth   . spironolactone (ALDACTONE) 100 mg Oral Tablet    . triamcinolone acetonide (ARISTOCORT A) 0.1 % Cream Apply topically   . tumeric-ging-olive-oreg-capryl 100 mg-150 mg- 50 mg-150 mg Oral Capsule Take by mouth 500 mg   . vitamin E 400 unit Oral Capsule Take 1 Capsule (400 Units total) by mouth       No Known Allergies      PHYSICAL  EXAM:  Most Recent Vitals    Flowsheet Row Office Visit from 01/14/2022 in Hematology/Oncology,   [facility]   Temperature 36.2 C (97.1 F) filed at... 01/14/2022 0908   Heart Rate 79 filed at... 01/14/2022 0908   BP (Non-Invasive) 156/82 filed at... 01/14/2022 0908   Height 1.803 m (5' 11" ) filed at... 01/14/2022 0908   Weight 109 kg (239 lb 12.8 oz) filed at... 01/14/2022 0908   BMI (Calculated) 33.52 filed at... 01/14/2022 0908   BSA (Calculated) 2.33 filed at... 01/14/2022 0908      Physical Exam  Constitutional:       Appearance: Normal appearance.   HENT:      Head: Normocephalic and atraumatic.      Nose: Nose normal.   Eyes:      Extraocular Movements: Extraocular movements intact.      Pupils: Pupils are equal, round, and reactive to light.   Cardiovascular:      Rate and Rhythm: Normal rate and regular rhythm.      Pulses: Normal pulses.      Heart sounds: Normal heart sounds.   Pulmonary:      Effort: Pulmonary effort is normal.      Breath sounds: Normal breath sounds.   Abdominal:      General: Abdomen is flat.      Palpations: Abdomen is soft.   Musculoskeletal:         General: Normal range of motion.      Cervical back: Normal range of motion and neck supple.   Skin:     General: Skin is warm.   Neurological:      General: No focal deficit present.      Mental Status: He is alert and oriented to person, place, and time.   Psychiatric:         Mood and Affect: Mood normal.  Behavior: Behavior normal.         ASSESSMENT:    ICD-10-CM    1. Multiple myeloma, remission status unspecified (CMS HCC)  C90.00 KAPPA AND LAMBDA FREE LIGHT CHAINS, SERUM     ELECTROPHORESIS, PROTEIN, SERUM             PLAN:   1. All relative external and internal medical records were reviewed including available H&Ps, progress notes, procedure notes, imaging's, laboratories, and pathology.   2. All labs from today were reviewed with the patient including details of exam finding's discussed.   Serum light  chain assay,SPEP in 3 weeks, follow up in 2 months.    Polly Cobia was given the chance to ask questions, and these were answered to their satisfaction. The patient is welcome to call with any questions or concerns in the meantime.     On the day of the encounter, a total of  40 minutes was spent on this patient encounter including review of historical information, examination, documentation and post-visit activities.   No follow-ups on file.     Livia Snellen, MD  01/14/2022, 09:54    CC:  Casimer Lanius, DO  [address]  [place] [id number]    Maurice Small [place], DO  [address]  [place],  [place] [phone]      This note was partially generated using MModal Fluency Direct system, and there may be some incorrect words, spellings, and punctuation that were not noted in checking the note before saving.

## 2022-02-06 ENCOUNTER — Other Ambulatory Visit (RURAL_HEALTH_CENTER): Payer: Self-pay | Admitting: INTERNAL MEDICINE

## 2022-02-06 MED ORDER — NIRMATRELVIR 300 MG (150 MG X2)-RITONAVIR 100 MG TABLET,DOSE PACK
3.0000 | ORAL_TABLET | Freq: Two times a day (BID) | ORAL | 0 refills | Status: DC
Start: 2022-02-06 — End: 2022-02-06

## 2022-02-06 MED ORDER — METHYLPREDNISOLONE 4 MG TABLETS IN A DOSE PACK
ORAL_TABLET | ORAL | 0 refills | Status: DC
Start: 2022-02-06 — End: 2022-04-08

## 2022-03-11 ENCOUNTER — Other Ambulatory Visit (HOSPITAL_COMMUNITY): Payer: Self-pay | Admitting: NURSE PRACTITIONER

## 2022-03-11 DIAGNOSIS — C9000 Multiple myeloma not having achieved remission: Secondary | ICD-10-CM

## 2022-03-14 ENCOUNTER — Other Ambulatory Visit: Payer: Medicare Other | Attending: NURSE PRACTITIONER

## 2022-03-14 ENCOUNTER — Other Ambulatory Visit: Payer: Self-pay

## 2022-03-14 DIAGNOSIS — C9000 Multiple myeloma not having achieved remission: Secondary | ICD-10-CM | POA: Insufficient documentation

## 2022-03-14 LAB — COMPREHENSIVE METABOLIC PANEL, NON-FASTING
ALBUMIN/GLOBULIN RATIO: 1.2 (ref 0.8–1.4)
ALBUMIN: 3.6 g/dL (ref 3.5–5.7)
ALKALINE PHOSPHATASE: 111 U/L — ABNORMAL HIGH (ref 34–104)
ALT (SGPT): 21 U/L (ref 7–52)
ANION GAP: 7 mmol/L — ABNORMAL LOW (ref 10–20)
AST (SGOT): 20 U/L (ref 13–39)
BILIRUBIN TOTAL: 0.3 mg/dL (ref 0.3–1.2)
BUN/CREA RATIO: 25 — ABNORMAL HIGH (ref 6–22)
BUN: 31 mg/dL — ABNORMAL HIGH (ref 7–25)
CALCIUM, CORRECTED: 9.5 mg/dL (ref 8.9–10.8)
CALCIUM: 9.1 mg/dL (ref 8.6–10.3)
CHLORIDE: 102 mmol/L (ref 98–107)
CO2 TOTAL: 26 mmol/L (ref 21–31)
CREATININE: 1.26 mg/dL (ref 0.60–1.30)
ESTIMATED GFR: 65 mL/min/{1.73_m2} (ref >59)
GLOBULIN: 3.1 (ref 2.9–5.4)
GLUCOSE: 184 mg/dL — ABNORMAL HIGH (ref 74–109)
OSMOLALITY, CALCULATED: 282 mOsm/kg (ref 270–290)
POTASSIUM: 3.1 mmol/L — ABNORMAL LOW (ref 3.5–5.1)
PROTEIN TOTAL: 6.7 g/dL (ref 6.4–8.9)
SODIUM: 135 mmol/L — ABNORMAL LOW (ref 136–145)

## 2022-03-14 LAB — CBC WITH DIFF
BASOPHIL #: 0 10*3/uL (ref 0.00–0.30)
BASOPHIL %: 1 % (ref 0–3)
EOSINOPHIL #: 0.1 10*3/uL (ref 0.00–0.80)
EOSINOPHIL %: 2 % (ref 0–7)
HCT: 36.3 % — ABNORMAL LOW (ref 42.0–51.0)
HGB: 12.8 g/dL — ABNORMAL LOW (ref 13.5–18.0)
LYMPHOCYTE #: 2.5 10*3/uL (ref 1.10–5.00)
LYMPHOCYTE %: 33 % (ref 25–45)
MCH: 35.8 pg — ABNORMAL HIGH (ref 27.0–32.0)
MCHC: 35.4 g/dL (ref 32.0–36.0)
MCV: 101.2 fL — ABNORMAL HIGH (ref 78.0–99.0)
MONOCYTE #: 0.7 10*3/uL (ref 0.00–1.30)
MONOCYTE %: 9 % (ref 0–12)
MPV: 8.4 fL (ref 7.4–10.4)
NEUTROPHIL #: 4.3 10*3/uL (ref 1.80–8.40)
NEUTROPHIL %: 56 % (ref 40–76)
PLATELETS: 188 10*3/uL (ref 140–440)
RBC: 3.58 10*6/uL — ABNORMAL LOW (ref 4.20–6.00)
RDW: 14.5 % (ref 11.6–14.8)
WBC: 7.7 10*3/uL (ref 4.0–10.5)
WBCS UNCORRECTED: 7.7 10*3/uL

## 2022-03-14 LAB — HGA1C (HEMOGLOBIN A1C WITH EST AVG GLUCOSE): HEMOGLOBIN A1C: 8 % — ABNORMAL HIGH (ref 4.0–6.0)

## 2022-03-18 LAB — KAPPA AND LAMBDA FREE LIGHT CHAINS, SERUM
KAPPA FREE LIGHT CHAINS: 3.05 mg/dL — ABNORMAL HIGH (ref 0.33–1.94)
KAPPA/LAMBDA FLC RATIO: 1.14 (ref 0.26–1.65)
LAMBDA FREE LIGHT CHAINS: 2.68 mg/dL — ABNORMAL HIGH (ref 0.57–2.63)

## 2022-03-25 ENCOUNTER — Encounter (HOSPITAL_COMMUNITY): Payer: Self-pay | Admitting: HEMATOLOGY-ONCOLOGY

## 2022-03-25 ENCOUNTER — Ambulatory Visit: Payer: Medicare Other | Attending: Hematology & Oncology | Admitting: HEMATOLOGY-ONCOLOGY

## 2022-03-25 ENCOUNTER — Other Ambulatory Visit: Payer: Self-pay

## 2022-03-25 VITALS — BP 139/68 | HR 86 | Temp 97.4°F | Ht 71.0 in | Wt 231.7 lb

## 2022-03-25 DIAGNOSIS — C9000 Multiple myeloma not having achieved remission: Secondary | ICD-10-CM | POA: Insufficient documentation

## 2022-03-25 DIAGNOSIS — Z9484 Stem cells transplant status: Secondary | ICD-10-CM

## 2022-03-25 DIAGNOSIS — Z807 Family history of other malignant neoplasms of lymphoid, hematopoietic and related tissues: Secondary | ICD-10-CM | POA: Insufficient documentation

## 2022-03-25 DIAGNOSIS — Z87891 Personal history of nicotine dependence: Secondary | ICD-10-CM | POA: Insufficient documentation

## 2022-03-25 DIAGNOSIS — C9001 Multiple myeloma in remission: Secondary | ICD-10-CM

## 2022-03-25 MED ORDER — TRIAMCINOLONE ACETONIDE 0.1 % TOPICAL OINTMENT
TOPICAL_OINTMENT | Freq: Two times a day (BID) | CUTANEOUS | 2 refills | Status: DC
Start: 2022-03-25 — End: 2022-06-10

## 2022-03-25 NOTE — Progress Notes (Addendum)
Department of Hematology/Oncology  Progress Note   Name: Connor Patterson  SVX:[id number]  Date of Birth: 1961/11/26  Encounter Date: 03/25/2022    REFERRING PROVIDER:  Casimer Lanius, [place] [address]  [place],  [place] [address]     TELEMEDICINE DOCUMENTATION:  Patient Location:  [facility], [facility] outpatient Hematology/Oncology [address] [place] [address]  Patient/family aware of provider location:  yes  Patient/family consent for telemedicine:  yes  Examination observed and performed by:  Patrcia Dolly APRN, FNP-BC, AOCNP    REASON FOR OFFICE VISIT:  Multiple Myeloma     HISTORY OF PRESENT ILLNESS:  Connor Patterson is a 61 y.o. male who presents today for follow up of multiple myeloma.  He was originally diagnosed in January 2004, and at that time had renal failure and a respiratory infection his serum electrophoresis showed a monoclonal protein of 0.1 grams/deciliter.  His bone marrow biopsy at that time was consistent with the presence of multiple myeloma, and a skeletal survey showed numerous lytic lesions and he also had an elevated calcium level.     He was being seen at [facility] and had VAD chemotherapy for 4 cycles.  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  He was treated with thalidomide in a maintenance fashion.  He developed osteonecrosis of the jaw in 2013, and his bisphosphonate therapy was discontinued at that time.  He was apparently switch to Revlimid for maintenance treatment, and it was discontinued in 2017 due to poor tolerance.    Since 2018, the patient has been off of any maintenance therapy.  His monoclonal protein levels have been very low and he has been considered to still be in remission.  He is being followed with observation and continues to follow up at [facility].    ROS:   Pertinent review of systems as discussed in HPI    HISTORY:  Past Medical History:   Diagnosis Date    Asthma     Diabetes mellitus, type 2 (CMS HCC)      Essential hypertension     Hx of transfusion          Past Surgical History:   Procedure Laterality Date    HX BACK SURGERY      3 times    HX COLONOSCOPY      LIMBAL STEM CELL TRANSPLANT      RENAL BIOPSY, OPEN           Social History     Socioeconomic History    Marital status: Married     Spouse name: Not on file    Number of children: Not on file    Years of education: Not on file    Highest education level: Not on file   Occupational History    Not on file   Tobacco Use    Smoking status: Never    Smokeless tobacco: Former   Brewing technologist Use: Never used   Substance and Sexual Activity    Alcohol use: Not on file    Drug use: Never    Sexual activity: Not on file   Other Topics Concern    Not on file   Social History Narrative    Not on file     Social Determinants of Health     Financial Resource Strain: Not on file   Transportation Needs: Not on file   Social Connections: Not on file   Intimate Partner  Violence: Not on file   Housing Stability: Not on file     Family Medical History:       Problem Relation (Age of Onset)    Autoimmune disease Mother    Carotid Stenosis Mother    Hodgkin's lymphoma Father            Current Outpatient Medications   Medication Sig    albuterol sulfate (PROVENTIL) 2.5 mg /3 mL (0.083 %) Inhalation nebulizer solution Take 3 mL (2.5 mg total) by inhalation    allopurinoL (ZYLOPRIM) 300 mg Oral Tablet     ascorbic acid, vitamin C, (VITAMIN C) 500 mg Oral Tablet Take 1 Tablet (500 mg total) by mouth    aspirin (ECOTRIN) 325 mg Oral Tablet, Delayed Release (E.C.) Take 1 Tablet (325 mg total) by mouth    BREO ELLIPTA 100-25 mcg/dose Inhalation Disk with Device Take 1 INHALATION by inhalation Once a day    chlorhexidine gluconate (PERIDEX) 0.12 % Mucous Membrane Mouthwash 15 mL    chlorTHALIDONE (HYGROTON) 25 mg Oral Tablet Take 1 Tablet (25 mg total) by mouth Once a day    cyanocobalamin (VITAMIN B 12) 1,000 mcg Oral Tablet Take 1 Tablet (1,000 mcg total) by mouth     cyclobenzaprine (FLEXERIL) 10 mg Oral Tablet Take 1 Tablet (10 mg total) by mouth Every night    famotidine (PEPCID) 20 mg Oral Tablet Take 1 Tablet (20 mg total) by mouth    furosemide (LASIX) 20 mg Oral Tablet Take 1 Tablet (20 mg total) by mouth    glipiZIDE (GLUCOTROL) 10 mg Oral Tablet Take 1 Tablet (10 mg total) by mouth Once a day    L.acid/L.casei/B.bif/B.lon/FOS (PROBIOTIC BLEND ORAL) Take 1 Capsule by mouth    levothyroxine (SYNTHROID) 100 mcg Oral Tablet Take 1 Tablet (100 mcg total) by mouth    magnesium oxide (MAG-OX) 400 mg Oral Tablet Take 1 Tablet (400 mg total) by mouth    Methylprednisolone (MEDROL DOSEPACK) 4 mg Oral Tablets, Dose Pack Take as instructed.    mometasone (NASONEX) 50 mcg/actuation Nasal Spray, Non-Aerosol 1 Spray    nystatin (MYCOSTATIN) 100,000 unit/mL Oral Suspension     ondansetron (ZOFRAN) 8 mg Oral Tablet Take 1 Tablet (8 mg total) by mouth    pentoxifylline (TRENTAL) 400 mg Oral Tablet Sustained Release Take 1 Tablet (400 mg total) by mouth    polyethylene glycol (MIRALAX) 17 gram/dose Oral Powder Take 3 teaspoons (17 g total) by mouth    potassium chloride (K-TAB) 20 mEq Oral Tablet Sustained Release Take by mouth Once a day    SALIVA STIMULANT COMB. NO.7 MM Rinse mouth daily as needed for dry mouth    semaglutide 3 mg Oral Tablet Take 1 Tablet (3 mg total) by mouth    spironolactone (ALDACTONE) 100 mg Oral Tablet     triamcinolone acetonide (ARISTOCORT A) 0.1 % Cream Apply topically    tumeric-ging-olive-oreg-capryl 100 mg-150 mg- 50 mg-150 mg Oral Capsule Take by mouth 500 mg    vitamin E 400 unit Oral Capsule Take 1 Capsule (400 Units total) by mouth     No Known Allergies    PHYSICAL EXAM:  Most Recent Vitals    Flowsheet Row Telemedicine from 03/25/2022 in Hematology/Oncology,   [facility]   Temperature 36.3 C (97.4 F) filed at... 03/25/2022 0916   Heart Rate 86 filed at... 03/25/2022 0916   Respiratory Rate --   BP (Non-Invasive) 139/68 filed at...  03/25/2022 0916   SpO2 --  Height 1.803 m (_0 ) filed at... 03/25/2022 0916   Weight 105 kg (231 lb 11.2 oz) filed at... 03/25/2022 0916   BMI (Calculated) 32.38 filed at... 03/25/2022 0916   BSA (Calculated) 2.29 filed at... 03/25/2022 0916      ECOG Status: (0) Fully active, able to carry on all predisease performance without restriction   Physical Exam  Vitals reviewed.   Constitutional:       Appearance: He is obese.   Eyes:      Pupils: Pupils are equal, round, and reactive to light.   Cardiovascular:      Rate and Rhythm: Normal rate and regular rhythm.      Heart sounds: Normal heart sounds. No murmur heard.  Pulmonary:      Effort: Pulmonary effort is normal.   Musculoskeletal:         General: Normal range of motion.      Cervical back: Normal range of motion.   Lymphadenopathy:      Head:      Right side of head: No submental, submandibular, tonsillar, preauricular, posterior auricular or occipital adenopathy.      Left side of head: No submental, submandibular, tonsillar, preauricular, posterior auricular or occipital adenopathy.      Cervical: No cervical adenopathy.      Right cervical: No superficial, deep or posterior cervical adenopathy.     Left cervical: No superficial, deep or posterior cervical adenopathy.      Upper Body:      Right upper body: No supraclavicular or axillary adenopathy.      Left upper body: No supraclavicular or axillary adenopathy.      Lower Body: No right inguinal adenopathy. No left inguinal adenopathy.   Skin:     General: Skin is warm and dry.      Capillary Refill: Capillary refill takes less than 2 seconds.   Neurological:      General: No focal deficit present.      Mental Status: He is alert and oriented to person, place, and time. Mental status is at baseline.      Motor: Motor function is intact.      Coordination: Coordination is intact.      Gait: Gait is intact.   Psychiatric:         Attention and Perception: Attention normal.         Mood and Affect: Mood  and affect normal.         Speech: Speech normal.         Behavior: Behavior normal.         Thought Content: Thought content normal.         Cognition and Memory: Cognition normal.         Judgment: Judgment normal.         DIAGNOSTIC DATA:  No results found for this or any previous visit (from the past 17520 hour(s)).    LABS:   CBC  Diff   Lab Results   Component Value Date/Time    WBC 7.7 03/14/2022 09:35 AM    HGB 12.8 (L) 03/14/2022 09:35 AM    HCT 36.3 (L) 03/14/2022 09:35 AM    PLTCNT 188 03/14/2022 09:35 AM    RBC 3.58 (L) 03/14/2022 09:35 AM    MCV 101.2 (H) 03/14/2022 09:35 AM    MCHC 35.4 03/14/2022 09:35 AM    MCH 35.8 (H) 03/14/2022 09:35 AM    RDW 14.5 03/14/2022 09:35 AM    MPV  8.4 03/14/2022 09:35 AM    Lab Results   Component Value Date/Time    PMNS 56 03/14/2022 09:35 AM    LYMPHOCYTES 33 03/14/2022 09:35 AM    EOSINOPHIL 2 03/14/2022 09:35 AM    MONOCYTES 9 03/14/2022 09:35 AM    BASOPHILS 1 03/14/2022 09:35 AM    BASOPHILS 0.00 03/14/2022 09:35 AM    PMNABS 4.30 03/14/2022 09:35 AM    LYMPHSABS 2.50 03/14/2022 09:35 AM    EOSABS 0.10 03/14/2022 09:35 AM    MONOSABS 0.70 03/14/2022 09:35 AM            Comprehensive Metabolic Profile    Lab Results   Component Value Date    SODIUM 135 (L) 03/14/2022    POTASSIUM 3.1 (L) 03/14/2022    CHLORIDE 102 03/14/2022    CO2 26 03/14/2022    ANIONGAP 7 (L) 03/14/2022    BUN 31 (H) 03/14/2022    CREATININE 1.26 03/14/2022    ALBUMIN 3.6 03/14/2022    CALCIUM 9.1 03/14/2022    GLUCOSENF 184 (H) 03/14/2022    ALKPHOS 111 (H) 03/14/2022    ALT 21 03/14/2022    AST 20 03/14/2022    TOTBILIRUBIN 0.3 03/14/2022    TOTALPROTEIN 6.7 03/14/2022          BASIC METABOLIC PANEL  Lab Results   Component Value Date    SODIUM 135 (L) 03/14/2022    POTASSIUM 3.1 (L) 03/14/2022    CHLORIDE 102 03/14/2022    CO2 26 03/14/2022    ANIONGAP 7 (L) 03/14/2022    BUN 31 (H) 03/14/2022    CREATININE 1.26 03/14/2022    BUNCRRATIO 25 (H) 03/14/2022    GFR 65 03/14/2022    CALCIUM 9.1  03/14/2022    GLUCOSENF 184 (H) 03/14/2022           ASSESSMENT:  Problem List Items Addressed This Visit    None  Visit Diagnoses       Multiple myeloma, remission status unspecified (CMS HCC)    -  Primary    Relevant Orders    COMPREHENSIVE METABOLIC PANEL, NON-FASTING    CBC/DIFF    KAPPA AND LAMBDA FREE LIGHT CHAINS, SERUM    MONOCLONAL GAMMOPATHY PROFILE WITH IMMUNOTYPING REFLEX             ICD-10-CM    1. Multiple myeloma, remission status unspecified (CMS HCC)  C90.00 COMPREHENSIVE METABOLIC PANEL, NON-FASTING     CBC/DIFF     KAPPA AND LAMBDA FREE LIGHT CHAINS, SERUM     MONOCLONAL GAMMOPATHY PROFILE WITH IMMUNOTYPING REFLEX           PLAN:   1. All relevant medical records were reviewed including available pertinent provider notes, procedure notes, imaging, laboratory, and pathology.   2. All pertinent labs and/or imaging were reviewed with the patient.   3. Multiple myeloma:  The patient is status post VAD induction, stem cell transplant, thalidomide maintenance followed by lenalidomide maintenance.  He has been on observation since 2018 and has not had any evidence of recurrence since that time.  We are basically following him as an [facility] patient, and we will recheck laboratory studies every 6 months.  He states that [facility] will be seeing him in June, so I will see him in December with repeat laboratory studies at that time.  4. Osteonecrosis of the jaw:  No further bisphosphonate therapy is planned.    Polly Cobia was given the chance to ask questions, and these  were answered to their satisfaction. The patient is welcome to call with any questions or concerns in the meantime.     The patient was seen as part of a collaborative telemedicine service with Dr. Jake Shark who participated in the encounter by active presence via approved video/audio means for portions of the encounter.  Dr. Jake Shark and Patrcia Dolly APRN, FNP-BC, AOCNP  discussed the physical assessment, reviewed and interpreted all  studies and the plan was mutually developed.       On the day of the encounter,  Patrcia Dolly APRN, FNP-BC, AOCNP spent a total of 10 minutes independently on this patient encounter including review of historical information, face to face patient care, documentation and post-visit activities.     Patrcia Dolly APRN, FNP-BC, AOCNP, 03/25/2022, 10:55        On the day of the encounter, a total of 45 minutes was spent on this patient encounter including review of historical information, examination, documentation and post-visit activities.   Return in about 33 weeks (around 11/11/2022).     Narda Rutherford, MD  03/25/2022, 10:33    CC:  Casimer Lanius, DO  [address]  [place] [address]    Casimer Lanius, DO  [place]  [place],  [place] [address]    This note was partially generated using MModal Fluency Direct system, and there may be some incorrect words, spellings, and punctuation that were not noted in checking the note before saving.

## 2022-03-25 NOTE — Patient Instructions (Addendum)
Continue to follow with [facility].  We will check labs in December and have your appointment completed in December as well.   If you have any problems or concerns in between your appointments please call our office at [phone] or you may contact us through the my chart app.

## 2022-04-08 ENCOUNTER — Other Ambulatory Visit: Payer: Self-pay

## 2022-04-08 ENCOUNTER — Encounter (RURAL_HEALTH_CENTER): Payer: Self-pay | Admitting: INTERNAL MEDICINE

## 2022-04-08 ENCOUNTER — Ambulatory Visit (RURAL_HEALTH_CENTER): Payer: Medicare Other | Attending: INTERNAL MEDICINE | Admitting: INTERNAL MEDICINE

## 2022-04-08 DIAGNOSIS — E039 Hypothyroidism, unspecified: Secondary | ICD-10-CM | POA: Insufficient documentation

## 2022-04-08 DIAGNOSIS — E1151 Type 2 diabetes mellitus with diabetic peripheral angiopathy without gangrene: Secondary | ICD-10-CM | POA: Insufficient documentation

## 2022-04-08 DIAGNOSIS — M47812 Spondylosis without myelopathy or radiculopathy, cervical region: Secondary | ICD-10-CM | POA: Insufficient documentation

## 2022-04-08 DIAGNOSIS — Z789 Other specified health status: Secondary | ICD-10-CM | POA: Insufficient documentation

## 2022-04-08 DIAGNOSIS — C9001 Multiple myeloma in remission: Secondary | ICD-10-CM | POA: Insufficient documentation

## 2022-04-08 DIAGNOSIS — I129 Hypertensive chronic kidney disease with stage 1 through stage 4 chronic kidney disease, or unspecified chronic kidney disease: Secondary | ICD-10-CM | POA: Insufficient documentation

## 2022-04-08 DIAGNOSIS — E782 Mixed hyperlipidemia: Secondary | ICD-10-CM | POA: Insufficient documentation

## 2022-04-08 DIAGNOSIS — I10 Essential (primary) hypertension: Secondary | ICD-10-CM | POA: Insufficient documentation

## 2022-04-08 DIAGNOSIS — G459 Transient cerebral ischemic attack, unspecified: Secondary | ICD-10-CM

## 2022-04-08 DIAGNOSIS — I739 Peripheral vascular disease, unspecified: Secondary | ICD-10-CM | POA: Insufficient documentation

## 2022-04-08 DIAGNOSIS — M1990 Unspecified osteoarthritis, unspecified site: Secondary | ICD-10-CM | POA: Insufficient documentation

## 2022-04-08 DIAGNOSIS — M109 Gout, unspecified: Secondary | ICD-10-CM | POA: Insufficient documentation

## 2022-04-08 DIAGNOSIS — M47816 Spondylosis without myelopathy or radiculopathy, lumbar region: Secondary | ICD-10-CM | POA: Insufficient documentation

## 2022-04-08 DIAGNOSIS — N1831 Chronic kidney disease, stage 3a: Secondary | ICD-10-CM | POA: Insufficient documentation

## 2022-04-08 DIAGNOSIS — G8929 Other chronic pain: Secondary | ICD-10-CM | POA: Insufficient documentation

## 2022-04-08 DIAGNOSIS — Z7984 Long term (current) use of oral hypoglycemic drugs: Secondary | ICD-10-CM | POA: Insufficient documentation

## 2022-04-08 DIAGNOSIS — M5441 Lumbago with sciatica, right side: Secondary | ICD-10-CM | POA: Insufficient documentation

## 2022-04-08 DIAGNOSIS — R600 Localized edema: Secondary | ICD-10-CM | POA: Insufficient documentation

## 2022-04-08 DIAGNOSIS — I701 Atherosclerosis of renal artery: Secondary | ICD-10-CM

## 2022-04-08 DIAGNOSIS — K219 Gastro-esophageal reflux disease without esophagitis: Secondary | ICD-10-CM | POA: Insufficient documentation

## 2022-04-08 DIAGNOSIS — J4520 Mild intermittent asthma, uncomplicated: Secondary | ICD-10-CM | POA: Insufficient documentation

## 2022-04-08 DIAGNOSIS — E1122 Type 2 diabetes mellitus with diabetic chronic kidney disease: Secondary | ICD-10-CM | POA: Insufficient documentation

## 2022-04-08 DIAGNOSIS — M5432 Sciatica, left side: Secondary | ICD-10-CM | POA: Insufficient documentation

## 2022-04-08 DIAGNOSIS — E119 Type 2 diabetes mellitus without complications: Secondary | ICD-10-CM | POA: Insufficient documentation

## 2022-04-08 DIAGNOSIS — R110 Nausea: Secondary | ICD-10-CM | POA: Insufficient documentation

## 2022-04-08 DIAGNOSIS — M545 Low back pain, unspecified: Secondary | ICD-10-CM | POA: Insufficient documentation

## 2022-04-08 DIAGNOSIS — M5442 Lumbago with sciatica, left side: Secondary | ICD-10-CM | POA: Insufficient documentation

## 2022-04-08 NOTE — Nursing Note (Signed)
Patient is here for his follow up. Patient reports no new problems.

## 2022-04-08 NOTE — Progress Notes (Signed)
[facility]  [address]  [place], [place]  [address]  Phone: [phone] Fax: [phone]    Name: Connor Patterson                       Date of Birth: [birth year]   MRN:  [id number]                         Date of visit: 04/08/2022     Chief Complaint: Follow Up 3 Months (No new problems.)    Current Outpatient Medications   Medication Sig   . albuterol sulfate (PROVENTIL) 2.5 mg /3 mL (0.083 %) Inhalation nebulizer solution Take 3 mL (2.5 mg total) by inhalation (Patient not taking: Reported on 04/08/2022)   . allopurinoL (ZYLOPRIM) 300 mg Oral Tablet    . ascorbic acid, vitamin C, (VITAMIN C) 500 mg Oral Tablet Take 1 Tablet (500 mg total) by mouth   . aspirin (ECOTRIN) 325 mg Oral Tablet, Delayed Release (E.C.) Take 1 Tablet (325 mg total) by mouth   . BREO ELLIPTA 100-25 mcg/dose Inhalation Disk with Device Take 1 INHALATION by inhalation Once a day   . chlorhexidine gluconate (PERIDEX) 0.12 % Mucous Membrane Mouthwash 15 mL   . chlorTHALIDONE (HYGROTON) 25 mg Oral Tablet Take 1 Tablet (25 mg total) by mouth Once a day   . cyanocobalamin (VITAMIN B 12) 1,000 mcg Oral Tablet Take 1 Tablet (1,000 mcg total) by mouth   . cyclobenzaprine (FLEXERIL) 10 mg Oral Tablet Take 1 Tablet (10 mg total) by mouth Every night   . famotidine (PEPCID) 20 mg Oral Tablet Take 1 Tablet (20 mg total) by mouth   . furosemide (LASIX) 20 mg Oral Tablet Take 1 Tablet (20 mg total) by mouth   . glipiZIDE (GLUCOTROL) 10 mg Oral Tablet Take 1 Tablet (10 mg total) by mouth Once a day   . L.acid/L.casei/B.bif/B.lon/FOS (PROBIOTIC BLEND ORAL) Take 1 Capsule by mouth   . levothyroxine (SYNTHROID) 100 mcg Oral Tablet Take 1 Tablet (100 mcg total) by mouth   . magnesium oxide (MAG-OX) 400 mg Oral Tablet Take 1 Tablet (400 mg total) by mouth   . Methylprednisolone (MEDROL DOSEPACK) 4 mg Oral Tablets, Dose Pack Take as instructed. (Patient not taking: Reported on 04/08/2022)   . mometasone (NASONEX) 50 mcg/actuation Nasal Spray, Non-Aerosol  1 Spray   . nystatin (MYCOSTATIN) 100,000 unit/mL Oral Suspension    . ondansetron (ZOFRAN) 8 mg Oral Tablet Take 1 Tablet (8 mg total) by mouth   . pentoxifylline (TRENTAL) 400 mg Oral Tablet Sustained Release Take 1 Tablet (400 mg total) by mouth   . polyethylene glycol (MIRALAX) 17 gram/dose Oral Powder Take 3 teaspoons (17 g total) by mouth   . potassium chloride (K-TAB) 20 mEq Oral Tablet Sustained Release Take by mouth Once a day   . SALIVA STIMULANT COMB. NO.7 MM Rinse mouth daily as needed for dry mouth   . semaglutide 3 mg Oral Tablet Take 1 Tablet (3 mg total) by mouth   . spironolactone (ALDACTONE) 100 mg Oral Tablet    . triamcinolone acetonide (ARISTOCORT A) 0.1 % Ointment Apply topically Twice daily   . tumeric-ging-olive-oreg-capryl 100 mg-150 mg- 50 mg-150 mg Oral Capsule Take by mouth 500 mg   . vitamin E 400 unit Oral Capsule Take 1 Capsule (400 Units total) by mouth       Patient Active Problem List    Diagnosis Date Noted   .  Benign hypertension 04/08/2022   . 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/08/2022   . Type 2 diabetes mellitus without complication, without long-term current use of insulin (CMS [place]) 04/08/2022   . Multiple myeloma in remission (CMS [place]) 04/08/2022   . Mixed hyperlipidemia 04/08/2022   . Acquired hypothyroidism 04/08/2022   . TIA (transient ischemic attack) 04/08/2022   . Gout 04/08/2022   . Chronic nausea 04/08/2022   . Osteoarthritis 04/08/2022   . Chronic low back pain 04/08/2022   . Bilateral sciatica 04/08/2022   . Mild intermittent asthma without complication 32/95/1884   . Peripheral vascular disease (CMS [place]) 04/08/2022   . Gastroesophageal reflux disease without esophagitis 04/08/2022   . Stage 3a chronic kidney disease (CMS [place]) 04/08/2022   . Localized edema 04/08/2022   . Statin intolerance 04/08/2022       Subjective:   Patient is here for CDM visit.    Lost 10#.     Still no further seizure like activity. This further reinforces my believe that it was not a  seizure. Will stop tracking at next visit unless recurs.     Hypertension  W/ Hx of renal artery stenosis  Following with nephrology    Diabetes mellitus type 2  Currently on glipizide and Rybelsus  Did not tolerate DPP4.  Not a candidate for metformin. Did not tolerate SGLT2 2/2 side pain.  Labs on 02-04-2019 showed hemoglobin A1c was 10.2.  Glipizide was added at that time  Labs on 05-24-2019 showed hemoglobin A1c 9.9  Patient resistant to starting insulin  09-2019 working on diet. has lost weight.  Labs on 09-06-2019 showed hemoglobin A1c 12.0, micro albumin creatnine ratio <30   * Suggested start Insulin, but patient only will accept oral agents  Labs on 12-13-2019 showed hemoglobin A1c 12.6, microalbumin creatinine ratio less than 30  03-2020 Samples of Jardiance given to patient. Tolerarated so increased to max oral dose.  06-2020 D/C Jardiance due to side pain  Labs on 06-08-2020 showed hemoglobin A1c 8.9   * Started Rybelsus  09-2020 Log 85-191  Labs on 12-15-2020 showed hemoglobin A1c 6.9  Labs on 03-16-2021 showed hemoglobin A1c 7.3  Labs on 09-10-2021 showed hemoglobin A1c 6.8  Labs on 01-07-2022 showed hemoglobin A1c 8.0    Multiple myeloma  Lambda light change, w/ renal disease  s/p autologous stem cell transplant, thalidomide and aredia chemotherapy  In Remission, was on Ninlaro maintenance  02-23-2020 No changes  05-17-2020 No changes.  06-14-2020 No changes  12-2020 Dr Herd has resumed care as they could not find anyone to cover. Patient will not need Ninlaro this year.  [id number] Saw Dr Herd. He is definitely retiring. Will be seeing Dr Jeanice Lim.  12-6008 Saw Dr Roxanne Mins. No changes.  [id number] Saw Dr Roxanne Mins. No changes. Will see [facility] tomorrow.  01-2022 Bone Scan 1. Negative exam for fractures, lytic or blastic lesions in the skull, spine, ribs, pelvis, humeri and femora.  2. 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    Mixed hyperlipidemia  Does not tolerate statins  except for Livalo.  Insurance will not cover Livalo  Have tried all available non-statin oral agents, and all of proven ineffective  Does not meet criteria for injectables  Labs on 02-04-2019 showed total cholesterol 244, triglycerides 280, HDL 30, direct LDL 132  Labs on 05-24-2019 showed total cholesterol 273, triglycerides 701, HDL 25, direct LDL 117  Labs on 09-06-2019 showed total cholesterol 203, triglycerides 324, HDL 33, direct  LDL 17  Labs on 12-13-2019 showed total cholesterol 212, triglycerides 217, HDL 32, direct LDL 144  Labs on 06-08-2020 showed total cholesterol 227, triglycerides 179, HDL 34, direct LDL 160  08-2020 Dr Bosie Helper resume Zetia despite lack of efficacy in prior attempt  Labs on 12-15-2020 showed total cholesterol 179, triglycerides 125, HDL 38, direct LDL 114  Labs on 03-16-2021 showed total cholesterol 192, triglycerides 102, HDL 47, calculated LDL 125  Labs on 07-04-2021 showed total cholesterol 210, triglycerides 130, HDL 38, direct LDL 152  Labs on 01-07-2022 showed total cholesterol 230, triglycerides 206, HDL 37,     Hypothyroidism  On Synthroid 100 mcg  Labs on 02-04-2019 showed TSH 1.58 on Synthroid 100 mcg  Labs on 05-24-2019 showed TSH 3.83 on Synthroid 100 mcg  Labs on 09-06-2019 showed TSH 0.90 on Synthroid 100 mcg  Labs on 12-13-2019 showed TSH 1.28 on Synthroid 100 mcg  Labs on 06-08-2020 showed TSH 1.53 on Synthroid 100 mcg  Labs on 12-15-2020 showed TSH 3.46 on Synthroid 100 mcg  Labs on 03-16-2021 showed TSH 3.05 on Synthroid 100 mcg  Labs on 07-04-2021 showed TSH 3.05 on Synthroid 100 mcg  Labs on 01-07-2022 showed TSH 0.81 on Synthroid 100 mcg    TIA  Occurred 06-2020  On aspirin and Zetia  Follow with Dr. Ezzard Flax    Gout  No flares since last visit  Labs on 09-06-2019 showed uric acid 5.8  Labs on 06-08-2020 showed uric acid 5.6  Labs on 12-15-2020 showed uric acid 5.8  Labs on 03-16-2021 showed uric acid 5.0  Labs on 07-04-2021 showed uric acid 4.7    Chronic  nausea  Most likely secondary to chemotherapy    Osteoarthritis/Low Back Pain/Sciatica  Known Disc Degeneration, had a spinal block about 6 years ago. Dr Gardiner Coins.  Previously on hydrocodone from oncology for pain associated with multiple myeloma.  Has since been weaned off  Currently on Flexeril  Resumed course of physical therapy  09-2019 on codeine from Dr Enriqueta Shutter  12-2019 Going to Dr Harl Bowie in [facility] for consultation on 27th. He had an initial interview with NP last month. Suspects will have surgery  01-27-2020 Had surgery with Dr Harl Bowie. Doing well. Is having physical therapy with Dr Romero Liner.  [id number] Knee pain has improved. Still having popping and cracking.    Asthma  Following with pulmonology    Edema  On lasix    Peripheral vascular disease  Doing well with pentoxifylline    GERD  Doing well with Pepcid    Chronic kidney disease stage III  Labs on 09-06-2019 showed BUN 37, creatinine 1.5, GFR 51  Labs on 12-13-2019 showed BUN 23, creatinine 1.3, GFR greater than 60  Labs on 06-10-2020 showed BUN 20, creatinine 1.4, GFR 55  Labs on 12-15-2020 showed BUN 28, creatinine 1.3, GFR greater than 60  Labs on 03-16-2021 showed BUN 28, creatinine 1.3, GFR greater than 60  Labs on 09-10-2021 showed BUN 31, creatinine 1.5, GFR 51  Labs on 01-07-2022 showed BUN 24, creatinine 1.2, GFR greater than 60    ROS:  10 systems reviewed and were negative except as noted.   + edema  + nausea  + abnormal gait, back pain, joint pain, muscle cramps, muscle weakness, numbness, tingling    Objective :  BP (!) 153/78 (Site: Left, Patient Position: Sitting, Cuff Size: Adult)   Pulse 89   Resp 18   Ht 1.803 m (_0 )   Wt 104 kg (229  lb 8 oz)   SpO2 97%   BMI 32.01 kg/m       General:  appears chronically ill and moderately obese  Lungs:  Clear to auscultation bilaterally.   Cardiovascular:  regular rate and rhythm  Neurologic:  Grossly normal  Psychiatric:  Normal    Data reviewed:  Oncology note, Local and [place] -  oncology    Assessment/Plan:  Assessment/Plan   1. Benign hypertension    2. Renal artery stenosis (CMS HCC)    3. Type 2 diabetes mellitus without complication, without long-term current use of insulin (CMS HCC)    4. 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)    5. Mixed hyperlipidemia    6. Acquired hypothyroidism    7. TIA (transient ischemic attack)    8. Gout, unspecified cause, unspecified chronicity, unspecified site    9. Chronic nausea    10. Osteoarthritis, unspecified osteoarthritis type, unspecified site    11. Chronic bilateral low back pain with bilateral sciatica    12. Bilateral sciatica    13. Mild intermittent asthma without complication    14. Peripheral vascular disease (CMS [place])    15. Gastroesophageal reflux disease without esophagitis    16. Stage 3a chronic kidney disease (CMS HCC)    17. Localized edema    18. Statin intolerance      Diabetes  Continue glipizide and Rybelsus  Further improved  Can consider injectable GLP1 - patient now willing. Still refuses insulin  Can adjust meds as needed due to doughnut    Hypertension  Continue diuretics  Follow with nephrology    Multiple myeloma  In remission  Follow with oncology    Mixed hyperlipidemia  Monitor    Hypothyroidism  Continue Synthroid    Nausea  Continue Zofran    Arthritis/back pain/sciatica  Continue Flexeril  Follow with Neurosurgery    Gout  Continue allopurinol    Asthma  Continue follow-up with pulmonology    Chronic edema  Continue diuretics    GERD  Continue Pepcid    Chronic disease stage III  Monitor    TIA  Follow with neurology    Sciatica  Follow with PT    Moderate decision making. >3 CDM, labs, medication.     Casimer Lanius, DO, [facility]  Internal Medicine

## 2022-04-30 ENCOUNTER — Telehealth (RURAL_HEALTH_CENTER): Payer: Self-pay | Admitting: INTERNAL MEDICINE

## 2022-04-30 MED ORDER — ALLOPURINOL 300 MG TABLET
300.0000 mg | ORAL_TABLET | Freq: Every day | ORAL | 5 refills | Status: DC
Start: 2022-04-30 — End: 2022-10-31

## 2022-04-30 MED ORDER — GLIPIZIDE 10 MG TABLET
10.0000 mg | ORAL_TABLET | Freq: Every day | ORAL | 5 refills | Status: DC
Start: 2022-04-30 — End: 2023-08-06

## 2022-05-07 ENCOUNTER — Ambulatory Visit (INDEPENDENT_AMBULATORY_CARE_PROVIDER_SITE_OTHER): Payer: Self-pay | Admitting: HEMATOLOGY-ONCOLOGY

## 2022-05-07 NOTE — Telephone Encounter (Addendum)
Called patient's wife, got answering machine. Will try to call back later today.----- Message from [facility] sent at 05/07/2022  8:25 AM EDT -----  Jake Shark Pt    Pts wife is needing a call back to discuss some issues that the pt is having. Pts wife states that there is a spot on the pts face that has been there for quite some time and has not gotten any better. Please contact the pts wife to discuss this further     Thank you

## 2022-05-22 ENCOUNTER — Other Ambulatory Visit (RURAL_HEALTH_CENTER): Payer: Self-pay | Admitting: INTERNAL MEDICINE

## 2022-06-07 ENCOUNTER — Other Ambulatory Visit (RURAL_HEALTH_CENTER): Payer: Self-pay | Admitting: INTERNAL MEDICINE

## 2022-06-10 ENCOUNTER — Other Ambulatory Visit (HOSPITAL_COMMUNITY): Payer: Self-pay | Admitting: NURSE PRACTITIONER

## 2022-06-26 ENCOUNTER — Encounter (RURAL_HEALTH_CENTER): Payer: Self-pay | Admitting: INTERNAL MEDICINE

## 2022-06-26 ENCOUNTER — Ambulatory Visit (RURAL_HEALTH_CENTER): Payer: Medicare Other | Attending: INTERNAL MEDICINE | Admitting: INTERNAL MEDICINE

## 2022-06-26 ENCOUNTER — Ambulatory Visit: Payer: Medicare Other | Attending: INTERNAL MEDICINE | Admitting: INTERNAL MEDICINE

## 2022-06-26 ENCOUNTER — Other Ambulatory Visit: Payer: Self-pay

## 2022-06-26 VITALS — BP 139/75 | HR 88 | Resp 16 | Ht 71.0 in | Wt 229.5 lb

## 2022-06-26 DIAGNOSIS — E119 Type 2 diabetes mellitus without complications: Secondary | ICD-10-CM | POA: Insufficient documentation

## 2022-06-26 DIAGNOSIS — Z7984 Long term (current) use of oral hypoglycemic drugs: Secondary | ICD-10-CM | POA: Insufficient documentation

## 2022-06-26 DIAGNOSIS — I10 Essential (primary) hypertension: Secondary | ICD-10-CM | POA: Insufficient documentation

## 2022-06-26 DIAGNOSIS — E782 Mixed hyperlipidemia: Secondary | ICD-10-CM | POA: Insufficient documentation

## 2022-06-26 DIAGNOSIS — B353 Tinea pedis: Secondary | ICD-10-CM | POA: Insufficient documentation

## 2022-06-26 LAB — COMPREHENSIVE METABOLIC PNL, FASTING
ALBUMIN/GLOBULIN RATIO: 1.4 (ref 0.8–1.4)
ALBUMIN: 4.2 g/dL (ref 3.5–5.7)
ALKALINE PHOSPHATASE: 115 U/L — ABNORMAL HIGH (ref 34–104)
ALT (SGPT): 21 U/L (ref 7–52)
ANION GAP: 9 mmol/L — ABNORMAL LOW (ref 10–20)
AST (SGOT): 21 U/L (ref 13–39)
BILIRUBIN TOTAL: 0.3 mg/dL (ref 0.3–1.2)
BUN/CREA RATIO: 22 (ref 6–22)
BUN: 32 mg/dL — ABNORMAL HIGH (ref 7–25)
CALCIUM, CORRECTED: 9.5 mg/dL (ref 8.9–10.8)
CALCIUM: 9.7 mg/dL (ref 8.6–10.3)
CHLORIDE: 101 mmol/L (ref 98–107)
CO2 TOTAL: 25 mmol/L (ref 21–31)
CREATININE: 1.47 mg/dL — ABNORMAL HIGH (ref 0.60–1.30)
ESTIMATED GFR: 54 mL/min/{1.73_m2} — ABNORMAL LOW (ref >59)
GLOBULIN: 2.9 (ref 2.9–5.4)
GLUCOSE: 144 mg/dL — ABNORMAL HIGH (ref 74–109)
OSMOLALITY, CALCULATED: 280 mOsm/kg (ref 270–290)
POTASSIUM: 3.9 mmol/L (ref 3.5–5.1)
PROTEIN TOTAL: 7.1 g/dL (ref 6.4–8.9)
SODIUM: 135 mmol/L — ABNORMAL LOW (ref 136–145)

## 2022-06-26 LAB — CBC WITH DIFF
BASOPHIL #: 0.1 10*3/uL (ref 0.00–0.30)
BASOPHIL %: 1 % (ref 0–3)
EOSINOPHIL #: 0.1 10*3/uL (ref 0.00–0.80)
EOSINOPHIL %: 1 % (ref 0–7)
HCT: 41.8 % — ABNORMAL LOW (ref 42.0–51.0)
HGB: 14.3 g/dL (ref 13.5–18.0)
LYMPHOCYTE #: 2.2 10*3/uL (ref 1.10–5.00)
LYMPHOCYTE %: 26 % (ref 25–45)
MCH: 34.5 pg — ABNORMAL HIGH (ref 27.0–32.0)
MCHC: 34.3 g/dL (ref 32.0–36.0)
MCV: 100.9 fL — ABNORMAL HIGH (ref 78.0–99.0)
MONOCYTE #: 0.8 10*3/uL (ref 0.00–1.30)
MONOCYTE %: 9 % (ref 0–12)
MPV: 9.4 fL (ref 7.4–10.4)
NEUTROPHIL #: 5.4 10*3/uL (ref 1.80–8.40)
NEUTROPHIL %: 63 % (ref 40–76)
PLATELETS: 204 10*3/uL (ref 140–440)
RBC: 4.14 10*6/uL — ABNORMAL LOW (ref 4.20–6.00)
RDW: 14 % (ref 11.6–14.8)
WBC: 8.5 10*3/uL (ref 4.0–10.5)
WBCS UNCORRECTED: 8.5 10*3/uL

## 2022-06-26 LAB — CREATININE URINE, RANDOM: CREATININE RANDOM URINE: 76 mg/dL — ABNORMAL HIGH (ref 11–26)

## 2022-06-26 LAB — LIPID PANEL
CHOL/HDL RATIO: 6.7
CHOLESTEROL: 228 mg/dL — ABNORMAL HIGH (ref <200)
HDL CHOL: 34 mg/dL (ref 23–92)
LDL CALC: 140 mg/dL — ABNORMAL HIGH (ref 0–100)
TRIGLYCERIDES: 271 mg/dL — ABNORMAL HIGH (ref <=150)
VLDL CALC: 54 mg/dL — ABNORMAL HIGH (ref 0–50)

## 2022-06-26 LAB — MICROALBUMIN/CREATININE RATIO, URINE, RANDOM
CREATININE RANDOM URINE: 76 mg/dL — ABNORMAL HIGH (ref 11–26)
MICROALBUMIN RANDOM URINE: 0.1 mg/dL
MICROALBUMIN/CREATININE RATIO RANDOM URINE: 1.3 mg/g

## 2022-06-26 LAB — MICROALBUMIN URINE, RANDOM: MICROALBUMIN RANDOM URINE: 0.1 mg/dL

## 2022-06-26 MED ORDER — TERBINAFINE HCL 250 MG TABLET
250.0000 mg | ORAL_TABLET | Freq: Every day | ORAL | 0 refills | Status: AC
Start: 2022-06-26 — End: 2022-07-06

## 2022-06-26 MED ORDER — TOLNAFTATE 1 % TOPICAL CREAM
TOPICAL_CREAM | Freq: Two times a day (BID) | CUTANEOUS | 0 refills | Status: DC
Start: 2022-06-26 — End: 2022-07-19

## 2022-06-26 NOTE — Nursing Note (Signed)
Patient is here with c/o  blisters on third , fourth, and fifth toe on his right foot and athletes feet between each toe.

## 2022-06-26 NOTE — Progress Notes (Signed)
[facility]  [address]  [place], [place]  [address]  Phone: [phone] Fax: [phone]    Name: Connor Patterson                       Date of Birth: July 08, 1961   MRN:  [id number]                         Date of visit: 06/26/2022     Chief Complaint: Foot Pain (Right foot)    Current Outpatient Medications   Medication Sig    allopurinoL (ZYLOPRIM) 300 mg Oral Tablet Take 1 Tablet (300 mg total) by mouth Once a day for 180 days    ascorbic acid, vitamin C, (VITAMIN C) 500 mg Oral Tablet Take 1 Tablet (500 mg total) by mouth    aspirin (ECOTRIN) 325 mg Oral Tablet, Delayed Release (E.C.) Take 1 Tablet (325 mg total) by mouth    BREO ELLIPTA 100-25 mcg/dose Inhalation Disk with Device Take 1 INHALATION  by inhalation Once a day    chlorhexidine gluconate (PERIDEX) 0.12 % Mucous Membrane Mouthwash 15 mL    chlorTHALIDONE (HYGROTON) 25 mg Oral Tablet Take 1 tablet by mouth once daily    cyanocobalamin (VITAMIN B 12) 1,000 mcg Oral Tablet Take 1 Tablet (1,000 mcg total) by mouth    cyclobenzaprine (FLEXERIL) 10 mg Oral Tablet Take 1 Tablet (10 mg total) by mouth Every night    famotidine (PEPCID) 20 mg Oral Tablet Take 1 Tablet (20 mg total) by mouth    furosemide (LASIX) 20 mg Oral Tablet Take 1 Tablet (20 mg total) by mouth    glipiZIDE (GLUCOTROL) 10 mg Oral Tablet Take 1 Tablet (10 mg total) by mouth Once a day for 180 days    ketoconazole (NIZORAL) 2 % Cream Apply 1 Application  topically Twice daily    L.acid/L.casei/B.bif/B.lon/FOS (PROBIOTIC BLEND ORAL) Take 1 Capsule by mouth    levothyroxine (SYNTHROID) 100 mcg Oral Tablet Take 1 Tablet (100 mcg total) by mouth    magnesium oxide (MAG-OX) 400 mg Oral Tablet Take 1 Tablet (400 mg total) by mouth    mometasone (NASONEX) 50 mcg/actuation Nasal Spray, Non-Aerosol 1 Spray    mupirocin (BACTROBAN) 2 % Ointment Apply 1 Application  topically Three times a day    nystatin (MYCOSTATIN) 100,000 unit/mL Oral Suspension     ondansetron (ZOFRAN) 8 mg Oral  Tablet Take 1 Tablet (8 mg total) by mouth    pentoxifylline (TRENTAL) 400 mg Oral Tablet Sustained Release Take 1 Tablet (400 mg total) by mouth    polyethylene glycol (MIRALAX) 17 gram/dose Oral Powder Take 3 teaspoons (17 g total) by mouth    potassium chloride (K-TAB) 20 mEq Oral Tablet Sustained Release Take by mouth Once a day    SALIVA STIMULANT COMB. NO.7 MM Rinse mouth daily as needed for dry mouth    semaglutide 3 mg Oral Tablet Take 1 Tablet (3 mg total) by mouth    spironolactone (ALDACTONE) 100 mg Oral Tablet Take 1 tablet by mouth once daily    SULFAMETHOXAZOLE 800 MG-TRIMETHOPRIM 160 MG TABLET Take 1 Tablet (160 mg total) by mouth Every 24 hours    terbinafine HCL (LAMISIL) 250 mg Oral Tablet Take 1 Tablet (250 mg total) by mouth Once a day for 10 days    tolnaftate (TINACTIN) 1 % Cream Apply topically Twice daily - in morning and at bedtime for 14 days  triamcinolone acetonide 0.1 % Ointment APPLY OINTMENT TOPICALLY TO AFFECTED AREA TWICE DAILY (2.6Z PER APPLICATION)    tumeric-ging-olive-oreg-capryl 100 mg-150 mg- 50 mg-150 mg Oral Capsule Take by mouth 500 mg    vitamin E 400 unit Oral Capsule Take 1 Capsule (400 Units total) by mouth       Patient Active Problem List    Diagnosis Date Noted    Benign hypertension 04/08/2022    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/08/2022    Type 2 diabetes mellitus without complication, without long-term current use of insulin (CMS HCC) 04/08/2022    Multiple myeloma in remission (CMS [place]) 04/08/2022    Mixed hyperlipidemia 04/08/2022    Acquired hypothyroidism 04/08/2022    TIA (transient ischemic attack) 04/08/2022    Gout 04/08/2022    Chronic nausea 04/08/2022    Osteoarthritis 04/08/2022    Chronic low back pain 04/08/2022    Bilateral sciatica 04/08/2022    Mild intermittent asthma without complication 12/45/8099    Peripheral vascular disease (CMS [place]) 04/08/2022    Gastroesophageal reflux disease without esophagitis 04/08/2022    Stage 3a chronic kidney  disease (CMS [place]) 04/08/2022    Localized edema 04/08/2022    Statin intolerance 04/08/2022       Subjective:   Patient is here for acute visit.    Complaining of right foot rash. Present about 10 days. He has been using combination of mupirocin and ketoconazole with only minimal improvement. He had tried lamisil AC first, but that didn't seem to help much at all.     ROS:  10 systems reviewed and were negative except as noted.   + edema  + nausea  + abnormal gait, back pain, joint pain, muscle cramps, muscle weakness, numbness, tingling    Objective :  BP 139/75 (Site: Right, Patient Position: Sitting, Cuff Size: Adult)   Pulse 88   Resp 16   Ht 1.803 m (_0 )   Wt 104 kg (229 lb 8 oz)   SpO2 97%   BMI 32.01 kg/m       General:  appears chronically ill and moderately obese  Lungs:  Clear to auscultation bilaterally.   Cardiovascular:  regular rate and rhythm  Neurologic:  Grossly normal  Psychiatric:  Normal  Erythema, peeling, and bulla right 3rd 4th 5th toes    Data reviewed:  Oncology note, Local and [place] - oncology    Assessment/Plan:  Assessment/Plan   1. Tinea pedis of right foot      Try cream and oral        Casimer Lanius, DO, [facility]  Internal Medicine

## 2022-06-27 LAB — HGA1C (HEMOGLOBIN A1C WITH EST AVG GLUCOSE): HEMOGLOBIN A1C: 7 % — ABNORMAL HIGH (ref 4.0–6.0)

## 2022-06-28 LAB — LDL CHOLESTEROL, DIRECT: LDL DIRECT: 170 mg/dL — ABNORMAL HIGH (ref <100)

## 2022-06-29 ENCOUNTER — Other Ambulatory Visit (RURAL_HEALTH_CENTER): Payer: Self-pay | Admitting: INTERNAL MEDICINE

## 2022-07-10 ENCOUNTER — Ambulatory Visit (RURAL_HEALTH_CENTER): Payer: Self-pay | Admitting: INTERNAL MEDICINE

## 2022-07-19 ENCOUNTER — Ambulatory Visit (RURAL_HEALTH_CENTER): Payer: Medicare Other | Attending: INTERNAL MEDICINE | Admitting: INTERNAL MEDICINE

## 2022-07-19 ENCOUNTER — Encounter (RURAL_HEALTH_CENTER): Payer: Self-pay | Admitting: INTERNAL MEDICINE

## 2022-07-19 ENCOUNTER — Other Ambulatory Visit: Payer: Self-pay

## 2022-07-19 VITALS — BP 137/76 | HR 78 | Resp 16 | Ht 71.0 in | Wt 227.2 lb

## 2022-07-19 DIAGNOSIS — T458X5A Adverse effect of other primarily systemic and hematological agents, initial encounter: Secondary | ICD-10-CM | POA: Insufficient documentation

## 2022-07-19 DIAGNOSIS — M5442 Lumbago with sciatica, left side: Secondary | ICD-10-CM | POA: Insufficient documentation

## 2022-07-19 DIAGNOSIS — N1831 Chronic kidney disease, stage 3a (CMS HCC): Secondary | ICD-10-CM | POA: Insufficient documentation

## 2022-07-19 DIAGNOSIS — E1151 Type 2 diabetes mellitus with diabetic peripheral angiopathy without gangrene: Secondary | ICD-10-CM | POA: Insufficient documentation

## 2022-07-19 DIAGNOSIS — E039 Hypothyroidism, unspecified: Secondary | ICD-10-CM | POA: Insufficient documentation

## 2022-07-19 DIAGNOSIS — K219 Gastro-esophageal reflux disease without esophagitis: Secondary | ICD-10-CM | POA: Insufficient documentation

## 2022-07-19 DIAGNOSIS — J4520 Mild intermittent asthma, uncomplicated: Secondary | ICD-10-CM

## 2022-07-19 DIAGNOSIS — M5441 Lumbago with sciatica, right side: Secondary | ICD-10-CM | POA: Insufficient documentation

## 2022-07-19 DIAGNOSIS — E782 Mixed hyperlipidemia: Secondary | ICD-10-CM | POA: Insufficient documentation

## 2022-07-19 DIAGNOSIS — C9001 Multiple myeloma in remission: Secondary | ICD-10-CM

## 2022-07-19 DIAGNOSIS — E1122 Type 2 diabetes mellitus with diabetic chronic kidney disease: Secondary | ICD-10-CM | POA: Insufficient documentation

## 2022-07-19 DIAGNOSIS — E119 Type 2 diabetes mellitus without complications: Secondary | ICD-10-CM

## 2022-07-19 DIAGNOSIS — G459 Transient cerebral ischemic attack, unspecified: Secondary | ICD-10-CM

## 2022-07-19 DIAGNOSIS — I739 Peripheral vascular disease, unspecified: Secondary | ICD-10-CM

## 2022-07-19 DIAGNOSIS — G8929 Other chronic pain: Secondary | ICD-10-CM | POA: Insufficient documentation

## 2022-07-19 DIAGNOSIS — I10 Essential (primary) hypertension: Secondary | ICD-10-CM

## 2022-07-19 DIAGNOSIS — R600 Localized edema: Secondary | ICD-10-CM | POA: Insufficient documentation

## 2022-07-19 DIAGNOSIS — I129 Hypertensive chronic kidney disease with stage 1 through stage 4 chronic kidney disease, or unspecified chronic kidney disease: Secondary | ICD-10-CM | POA: Insufficient documentation

## 2022-07-19 DIAGNOSIS — M47812 Spondylosis without myelopathy or radiculopathy, cervical region: Secondary | ICD-10-CM | POA: Insufficient documentation

## 2022-07-19 DIAGNOSIS — M1990 Unspecified osteoarthritis, unspecified site: Secondary | ICD-10-CM

## 2022-07-19 DIAGNOSIS — I701 Atherosclerosis of renal artery: Secondary | ICD-10-CM

## 2022-07-19 DIAGNOSIS — M109 Gout, unspecified: Secondary | ICD-10-CM | POA: Insufficient documentation

## 2022-07-19 DIAGNOSIS — M87180 Osteonecrosis due to drugs, jaw: Secondary | ICD-10-CM

## 2022-07-19 DIAGNOSIS — Z7984 Long term (current) use of oral hypoglycemic drugs: Secondary | ICD-10-CM | POA: Insufficient documentation

## 2022-07-19 MED ORDER — TOLNAFTATE 1 % TOPICAL CREAM
TOPICAL_CREAM | Freq: Two times a day (BID) | CUTANEOUS | 0 refills | Status: AC
Start: 2022-07-19 — End: 2022-08-02

## 2022-07-19 NOTE — Progress Notes (Signed)
[facility]  [address]  [place], [place]  [address]  Phone: [phone] Fax: [phone]    Name: Connor Patterson                       Date of Birth: [birth year]   MRN:  [id number]                         Date of visit: 07/19/2022     Chief Complaint: Follow Up 3 Months    Current Outpatient Medications   Medication Sig    allopurinoL (ZYLOPRIM) 300 mg Oral Tablet Take 1 Tablet (300 mg total) by mouth Once a day for 180 days    ascorbic acid, vitamin C, (VITAMIN C) 500 mg Oral Tablet Take 1 Tablet (500 mg total) by mouth    aspirin (ECOTRIN) 325 mg Oral Tablet, Delayed Release (E.C.) Take 1 Tablet (325 mg total) by mouth    BREO ELLIPTA 100-25 mcg/dose Inhalation Disk with Device Take 1 INHALATION  by inhalation Once a day    chlorhexidine gluconate (PERIDEX) 0.12 % Mucous Membrane Mouthwash 15 mL    chlorTHALIDONE (HYGROTON) 25 mg Oral Tablet Take 1 tablet by mouth once daily    cyanocobalamin (VITAMIN B 12) 1,000 mcg Oral Tablet Take 1 Tablet (1,000 mcg total) by mouth    cyclobenzaprine (FLEXERIL) 10 mg Oral Tablet Take 1 Tablet (10 mg total) by mouth Every night    famotidine (PEPCID) 20 mg Oral Tablet Take 1 Tablet (20 mg total) by mouth    furosemide (LASIX) 20 mg Oral Tablet Take 1 Tablet (20 mg total) by mouth    glipiZIDE (GLUCOTROL) 10 mg Oral Tablet Take 1 Tablet (10 mg total) by mouth Once a day for 180 days    ketoconazole (NIZORAL) 2 % Cream Apply 1 Application  topically Twice daily    L.acid/L.casei/B.bif/B.lon/FOS (PROBIOTIC BLEND ORAL) Take 1 Capsule by mouth    levothyroxine (SYNTHROID) 100 mcg Oral Tablet Take 1 tablet by mouth once daily    magnesium oxide (MAG-OX) 400 mg Oral Tablet Take 1 Tablet (400 mg total) by mouth    mometasone (NASONEX) 50 mcg/actuation Nasal Spray, Non-Aerosol 1 Spray    mupirocin (BACTROBAN) 2 % Ointment Apply 1 Application  topically Three times a day    nystatin (MYCOSTATIN) 100,000 unit/mL Oral Suspension     ondansetron (ZOFRAN) 8 mg Oral Tablet Take  1 Tablet (8 mg total) by mouth    pentoxifylline (TRENTAL) 400 mg Oral Tablet Sustained Release Take 1 Tablet (400 mg total) by mouth    polyethylene glycol (MIRALAX) 17 gram/dose Oral Powder Take 3 teaspoons (17 g total) by mouth    potassium chloride (K-TAB) 20 mEq Oral Tablet Sustained Release Take by mouth Once a day    SALIVA STIMULANT COMB. NO.7 MM Rinse mouth daily as needed for dry mouth    semaglutide 3 mg Oral Tablet Take 1 Tablet (3 mg total) by mouth    spironolactone (ALDACTONE) 100 mg Oral Tablet Take 1 tablet by mouth once daily    SULFAMETHOXAZOLE 800 MG-TRIMETHOPRIM 160 MG TABLET Take 1 Tablet (160 mg total) by mouth Every 24 hours    triamcinolone acetonide 0.1 % Ointment APPLY OINTMENT TOPICALLY TO AFFECTED AREA TWICE DAILY (3.5K PER APPLICATION)    tumeric-ging-olive-oreg-capryl 100 mg-150 mg- 50 mg-150 mg Oral Capsule Take by mouth 500 mg    vitamin E 400 unit Oral Capsule Take 1 Capsule (  400 Units total) by mouth       Patient Active Problem List    Diagnosis Date Noted    Bisphosphonate-associated osteonecrosis of the jaw (CMS [place]) 07/19/2022    Benign hypertension 04/08/2022    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/08/2022    Type 2 diabetes mellitus without complication, without long-term current use of insulin (CMS [place]) 04/08/2022    Multiple myeloma in remission (CMS [place]) 04/08/2022    Mixed hyperlipidemia 04/08/2022    Acquired hypothyroidism 04/08/2022    TIA (transient ischemic attack) 04/08/2022    Gout 04/08/2022    Chronic nausea 04/08/2022    Osteoarthritis 04/08/2022    Chronic low back pain 04/08/2022    Bilateral sciatica 04/08/2022    Mild intermittent asthma without complication 18/29/9371    Peripheral vascular disease (CMS [place]) 04/08/2022    Gastroesophageal reflux disease without esophagitis 04/08/2022    Stage 3a chronic kidney disease (CMS [place]) 04/08/2022    Localized edema 04/08/2022    Statin intolerance 04/08/2022       Subjective:   Patient is here for CDM visit.    Lost  2#.     Seen 07-23 for tinea pedis. Mostly, but not fully resolved. Has apt with podiatry later this summer.     Hypertension  W/ Hx of renal artery stenosis  Following with nephrology    Diabetes mellitus type 2  Currently on glipizide and Rybelsus  Did not tolerate DPP4.  Not a candidate for metformin. Did not tolerate SGLT2 2/2 side pain.  Labs on 02-04-2019 showed hemoglobin A1c was 10.2.  Glipizide was added at that time  Labs on 05-24-2019 showed hemoglobin A1c 9.9  Patient resistant to starting insulin  09-2019 working on diet. has lost weight.  Labs on 09-06-2019 showed hemoglobin A1c 12.0, micro albumin creatnine ratio <30   * Suggested start Insulin, but patient only will accept oral agents  Labs on 12-13-2019 showed hemoglobin A1c 12.6, microalbumin creatinine ratio less than 30  03-2020 Samples of Jardiance given to patient. Tolerarated so increased to max oral dose.  06-2020 D/C Jardiance due to side pain  Labs on 06-08-2020 showed hemoglobin A1c 8.9   * Started Rybelsus  09-2020 Log 85-191  Labs on 12-15-2020 showed hemoglobin A1c 6.9  Labs on 03-16-2021 showed hemoglobin A1c 7.3  Labs on 09-10-2021 showed hemoglobin A1c 6.8  Labs on 01-07-2022 showed hemoglobin A1c 8.0  Labs on 06/26/2022 showed hemoglobin A1c 7.0, microalbumin creatinine ratio less than 30    Multiple myeloma  Lambda light change, w/ renal disease  s/p autologous stem cell transplant, thalidomide and aredia chemotherapy  In Remission, was on Ninlaro maintenance  02-23-2020 No changes  05-17-2020 No changes.  06-14-2020 No changes  12-2020 Dr Herd has resumed care as they could not find anyone to cover. Patient will not need Ninlaro this year.  [phone] Saw Dr Herd. He is definitely retiring. Will be seeing Dr Jeanice Lim.  [phone] Saw Dr Roxanne Mins. No changes.  [phone] Saw Dr Roxanne Mins. No changes. Will see [facility] tomorrow.  01-2022 Bone Scan 1. Negative exam for fractures, lytic or blastic lesions in the skull, spine, ribs, pelvis, humeri and  femora.  2. 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    Mixed hyperlipidemia  Does not tolerate statins except for Livalo.  Insurance will not cover Livalo  Have tried all available non-statin oral agents, and all of proven ineffective  Does not meet criteria for injectables  Labs  on 02-04-2019 showed total cholesterol 244, triglycerides 280, HDL 30, direct LDL 132  Labs on 05-24-2019 showed total cholesterol 273, triglycerides 701, HDL 25, direct LDL 117  Labs on 09-06-2019 showed total cholesterol 203, triglycerides 324, HDL 33, direct LDL 17  Labs on 12-13-2019 showed total cholesterol 212, triglycerides 217, HDL 32, direct LDL 144  Labs on 06-08-2020 showed total cholesterol 227, triglycerides 179, HDL 34, direct LDL 160  08-2020 Dr Bosie Helper resume Zetia despite lack of efficacy in prior attempt  Labs on 12-15-2020 showed total cholesterol 179, triglycerides 125, HDL 38, direct LDL 114  Labs on 03-16-2021 showed total cholesterol 192, triglycerides 102, HDL 47, calculated LDL 125  Labs on 07-04-2021 showed total cholesterol 210, triglycerides 130, HDL 38, direct LDL 152  Labs on 01-07-2022 showed total cholesterol 230, triglycerides 206, HDL 37,   Labs on 06/26/2022 showed total cholesterol 228, triglycerides 271, HDL 34, direct LDL 170    Hypothyroidism  On Synthroid 100 mcg  Labs on 02-04-2019 showed TSH 1.58 on Synthroid 100 mcg  Labs on 05-24-2019 showed TSH 3.83 on Synthroid 100 mcg  Labs on 09-06-2019 showed TSH 0.90 on Synthroid 100 mcg  Labs on 12-13-2019 showed TSH 1.28 on Synthroid 100 mcg  Labs on 06-08-2020 showed TSH 1.53 on Synthroid 100 mcg  Labs on 12-15-2020 showed TSH 3.46 on Synthroid 100 mcg  Labs on 03-16-2021 showed TSH 3.05 on Synthroid 100 mcg  Labs on 07-04-2021 showed TSH 3.05 on Synthroid 100 mcg  Labs on 01-07-2022 showed TSH 0.81 on Synthroid 100 mcg    TIA  Occurred 06-2020  On aspirin and Zetia  Follow with Dr. Ezzard Flax    Gout  No flares  since last visit  Labs on 09-06-2019 showed uric acid 5.8  Labs on 06-08-2020 showed uric acid 5.6  Labs on 12-15-2020 showed uric acid 5.8  Labs on 03-16-2021 showed uric acid 5.0  Labs on 07-04-2021 showed uric acid 4.7    Chronic nausea  Most likely secondary to chemotherapy    Osteoarthritis/Low Back Pain/Sciatica  Known Disc Degeneration, had a spinal block about 6 years ago. Dr Gardiner Coins.  Previously on hydrocodone from oncology for pain associated with multiple myeloma.  Has since been weaned off  Currently on Flexeril  Resumed course of physical therapy  09-2019 on codeine from Dr Enriqueta Shutter  12-2019 Going to Dr Harl Bowie in [facility] for consultation on 27th. He had an initial interview with NP last month. Suspects will have surgery  01-27-2020 Had surgery with Dr Harl Bowie. Doing well. Is having physical therapy with Dr Romero Liner.  [phone] Knee pain has improved. Still having popping and cracking.    Asthma  Following with pulmonology    Edema  On lasix    Peripheral vascular disease  Doing well with pentoxifylline    GERD  Doing well with Pepcid    Chronic kidney disease stage III  Labs on 09-06-2019 showed BUN 37, creatinine 1.5, GFR 51  Labs on 12-13-2019 showed BUN 23, creatinine 1.3, GFR greater than 60  Labs on 06-10-2020 showed BUN 20, creatinine 1.4, GFR 55  Labs on 12-15-2020 showed BUN 28, creatinine 1.3, GFR greater than 60  Labs on 03-16-2021 showed BUN 28, creatinine 1.3, GFR greater than 60  Labs on 09-10-2021 showed BUN 31, creatinine 1.5, GFR 51  Labs on 01-07-2022 showed BUN 24, creatinine 1.2, GFR greater than 60  Labs on 06/26/2022 showed BUN 32, creatinine 1.47, GFR 54    ROS:  10  systems reviewed and were negative except as noted.   + edema  + nausea  + abnormal gait, back pain, joint pain, muscle cramps, muscle weakness, numbness, tingling    Objective :  BP 137/76 (Site: Right, Patient Position: Sitting, Cuff Size: Adult)   Pulse 78   Resp 16   Ht 1.803 m (_0 )   Wt 103 kg (227 lb 4 oz)    SpO2 97%   BMI 31.69 kg/m       General:  appears chronically ill and moderately obese  Lungs:  Clear to auscultation bilaterally.   Cardiovascular:  regular rate and rhythm  Neurologic:  Grossly normal  Psychiatric:  Normal    Data reviewed:  Head & neck surgery - stable osteonecrosis    Assessment/Plan:  Assessment/Plan   1. Type 2 diabetes mellitus without complication, without long-term current use of insulin (CMS HCC)    2. Mixed hyperlipidemia    3. Benign hypertension    4. Peripheral vascular disease (CMS [place])    5. Renal artery stenosis (CMS HCC)    6. Mild intermittent asthma without complication    7. 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 TIA (transient ischemic attack)    9. Gastroesophageal reflux disease without esophagitis    10. Acquired hypothyroidism    11. Chronic bilateral low back pain with bilateral sciatica    12. Osteoarthritis, unspecified osteoarthritis type, unspecified site    13. Gout, unspecified cause, unspecified chronicity, unspecified site    14. Localized edema    15. 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)    16. 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)        Diabetes  Continue glipizide and Rybelsus  Further improved  Can consider injectable GLP1 - patient now willing. Still refuses insulin  Can adjust meds as needed due to doughnut    Hypertension  Continue diuretics  Follow with nephrology    Multiple myeloma  In remission  Follow with oncology    Mixed hyperlipidemia  Monitor    Hypothyroidism  Continue Synthroid    Nausea  Continue Zofran    Arthritis/back pain/sciatica  Continue Flexeril  Follow with Neurosurgery    Gout  Continue allopurinol    Asthma  Continue follow-up with pulmonology    Chronic edema  Continue diuretics    GERD  Continue Pepcid    Chronic disease stage III  Monitor    TIA  Follow with neurology    Sciatica  Follow with PT    Moderate decision making. >3 CDM, labs, medication.     Casimer Lanius, DO,  [facility]  Internal Medicine

## 2022-07-19 NOTE — Nursing Note (Signed)
Patient is here for his follow up. Patient reports no new problems.

## 2022-09-04 ENCOUNTER — Other Ambulatory Visit (RURAL_HEALTH_CENTER): Payer: Self-pay | Admitting: INTERNAL MEDICINE

## 2022-09-10 ENCOUNTER — Other Ambulatory Visit (RURAL_HEALTH_CENTER): Payer: Self-pay | Admitting: INTERNAL MEDICINE

## 2022-09-11 ENCOUNTER — Telehealth (RURAL_HEALTH_CENTER): Payer: Self-pay | Admitting: INTERNAL MEDICINE

## 2022-09-11 MED ORDER — SEMAGLUTIDE 3 MG TABLET
3.0000 mg | ORAL_TABLET | Freq: Every day | ORAL | 1 refills | Status: DC
Start: 2022-09-11 — End: 2023-01-31

## 2022-09-11 NOTE — Telephone Encounter (Signed)
sent 

## 2022-09-11 NOTE — Telephone Encounter (Signed)
Pt needs refill on his semaglutide tablets

## 2022-10-21 ENCOUNTER — Other Ambulatory Visit (RURAL_HEALTH_CENTER): Payer: Self-pay | Admitting: INTERNAL MEDICINE

## 2022-10-31 ENCOUNTER — Encounter (RURAL_HEALTH_CENTER): Payer: Self-pay | Admitting: INTERNAL MEDICINE

## 2022-10-31 ENCOUNTER — Other Ambulatory Visit: Payer: Self-pay

## 2022-10-31 ENCOUNTER — Ambulatory Visit (RURAL_HEALTH_CENTER): Payer: Medicare Other | Attending: INTERNAL MEDICINE | Admitting: INTERNAL MEDICINE

## 2022-10-31 ENCOUNTER — Ambulatory Visit: Payer: Medicare Other | Attending: INTERNAL MEDICINE | Admitting: INTERNAL MEDICINE

## 2022-10-31 VITALS — BP 146/82 | HR 73 | Resp 18 | Ht 71.0 in | Wt 217.2 lb

## 2022-10-31 DIAGNOSIS — G8929 Other chronic pain: Secondary | ICD-10-CM | POA: Insufficient documentation

## 2022-10-31 DIAGNOSIS — I10 Essential (primary) hypertension: Secondary | ICD-10-CM | POA: Insufficient documentation

## 2022-10-31 DIAGNOSIS — J4520 Mild intermittent asthma, uncomplicated: Secondary | ICD-10-CM | POA: Insufficient documentation

## 2022-10-31 DIAGNOSIS — E782 Mixed hyperlipidemia: Secondary | ICD-10-CM | POA: Insufficient documentation

## 2022-10-31 DIAGNOSIS — N1831 Chronic kidney disease, stage 3a (CMS HCC): Secondary | ICD-10-CM

## 2022-10-31 DIAGNOSIS — E039 Hypothyroidism, unspecified: Secondary | ICD-10-CM | POA: Insufficient documentation

## 2022-10-31 DIAGNOSIS — R110 Nausea: Secondary | ICD-10-CM | POA: Insufficient documentation

## 2022-10-31 DIAGNOSIS — C9001 Multiple myeloma in remission: Secondary | ICD-10-CM

## 2022-10-31 DIAGNOSIS — M5431 Sciatica, right side: Secondary | ICD-10-CM | POA: Insufficient documentation

## 2022-10-31 DIAGNOSIS — M5432 Sciatica, left side: Secondary | ICD-10-CM

## 2022-10-31 DIAGNOSIS — M1990 Unspecified osteoarthritis, unspecified site: Secondary | ICD-10-CM | POA: Insufficient documentation

## 2022-10-31 DIAGNOSIS — I739 Peripheral vascular disease, unspecified: Secondary | ICD-10-CM | POA: Insufficient documentation

## 2022-10-31 DIAGNOSIS — G629 Polyneuropathy, unspecified: Secondary | ICD-10-CM | POA: Insufficient documentation

## 2022-10-31 DIAGNOSIS — E119 Type 2 diabetes mellitus without complications: Secondary | ICD-10-CM | POA: Insufficient documentation

## 2022-10-31 DIAGNOSIS — I701 Atherosclerosis of renal artery: Secondary | ICD-10-CM | POA: Insufficient documentation

## 2022-10-31 DIAGNOSIS — M5441 Lumbago with sciatica, right side: Secondary | ICD-10-CM | POA: Insufficient documentation

## 2022-10-31 DIAGNOSIS — K219 Gastro-esophageal reflux disease without esophagitis: Secondary | ICD-10-CM | POA: Insufficient documentation

## 2022-10-31 DIAGNOSIS — M87180 Osteonecrosis due to drugs, jaw: Secondary | ICD-10-CM | POA: Insufficient documentation

## 2022-10-31 DIAGNOSIS — M5442 Lumbago with sciatica, left side: Secondary | ICD-10-CM | POA: Insufficient documentation

## 2022-10-31 DIAGNOSIS — M109 Gout, unspecified: Secondary | ICD-10-CM | POA: Insufficient documentation

## 2022-10-31 DIAGNOSIS — T458X5A Adverse effect of other primarily systemic and hematological agents, initial encounter: Secondary | ICD-10-CM

## 2022-10-31 LAB — CBC WITH DIFF
BASOPHIL #: 0 10*3/uL (ref 0.00–0.10)
BASOPHIL %: 1 % (ref 0–1)
EOSINOPHIL #: 0.1 10*3/uL (ref 0.00–0.50)
EOSINOPHIL %: 1 %
HCT: 41.9 % (ref 36.7–47.1)
HGB: 14.3 g/dL (ref 12.5–16.3)
LYMPHOCYTE #: 1.9 10*3/uL (ref 1.00–3.00)
LYMPHOCYTE %: 27 % (ref 16–44)
MCH: 35.2 pg — ABNORMAL HIGH (ref 23.8–33.4)
MCHC: 34.2 g/dL (ref 32.5–36.3)
MCV: 103.1 fL — ABNORMAL HIGH (ref 73.0–96.2)
MONOCYTE #: 0.8 10*3/uL (ref 0.30–1.00)
MONOCYTE %: 12 % (ref 5–13)
MPV: 9.4 fL (ref 7.4–11.4)
NEUTROPHIL #: 4.3 10*3/uL (ref 1.85–7.80)
NEUTROPHIL %: 60 % (ref 43–77)
PLATELETS: 220 10*3/uL (ref 140–440)
RBC: 4.06 10*6/uL (ref 4.06–5.63)
RDW: 14 % (ref 12.1–16.2)
WBC: 7.2 10*3/uL (ref 3.6–10.2)

## 2022-10-31 LAB — COMPREHENSIVE METABOLIC PNL, FASTING
ALBUMIN/GLOBULIN RATIO: 1.4 (ref 0.8–1.4)
ALBUMIN: 4.2 g/dL (ref 3.5–5.7)
ALKALINE PHOSPHATASE: 93 U/L (ref 34–104)
ALT (SGPT): 20 U/L (ref 7–52)
ANION GAP: 9 mmol/L (ref 4–13)
AST (SGOT): 19 U/L (ref 13–39)
BILIRUBIN TOTAL: 0.5 mg/dL (ref 0.3–1.2)
BUN/CREA RATIO: 21 (ref 6–22)
BUN: 27 mg/dL — ABNORMAL HIGH (ref 7–25)
CALCIUM, CORRECTED: 9.5 mg/dL (ref 8.9–10.8)
CALCIUM: 9.7 mg/dL (ref 8.6–10.3)
CHLORIDE: 101 mmol/L (ref 98–107)
CO2 TOTAL: 26 mmol/L (ref 21–31)
CREATININE: 1.26 mg/dL (ref 0.60–1.30)
ESTIMATED GFR: 65 mL/min/{1.73_m2} (ref >59)
GLOBULIN: 2.9 (ref 2.9–5.4)
GLUCOSE: 139 mg/dL — ABNORMAL HIGH (ref 74–109)
OSMOLALITY, CALCULATED: 279 mOsm/kg (ref 270–290)
POTASSIUM: 3.6 mmol/L (ref 3.5–5.1)
PROTEIN TOTAL: 7.1 g/dL (ref 6.4–8.9)
SODIUM: 136 mmol/L (ref 136–145)

## 2022-10-31 LAB — VITAMIN B12: VITAMIN B 12: 681 pg/mL (ref 180–914)

## 2022-10-31 LAB — LIPID PANEL
CHOL/HDL RATIO: 6.4
CHOLESTEROL: 230 mg/dL — ABNORMAL HIGH (ref <200)
HDL CHOL: 36 mg/dL (ref 23–92)
LDL CALC: 164 mg/dL — ABNORMAL HIGH (ref 0–100)
TRIGLYCERIDES: 150 mg/dL (ref <=150)
VLDL CALC: 30 mg/dL (ref 0–50)

## 2022-10-31 LAB — URIC ACID: URIC ACID: 5.7 mg/dL (ref 2.3–7.6)

## 2022-10-31 LAB — HGA1C (HEMOGLOBIN A1C WITH EST AVG GLUCOSE): HEMOGLOBIN A1C: 6.5 % — ABNORMAL HIGH (ref 4.0–6.0)

## 2022-10-31 LAB — THYROID STIMULATING HORMONE (SENSITIVE TSH): TSH: 1.649 u[IU]/mL (ref 0.450–5.330)

## 2022-10-31 MED ORDER — ALLOPURINOL 300 MG TABLET
300.0000 mg | ORAL_TABLET | Freq: Every day | ORAL | 1 refills | Status: DC
Start: 2022-10-31 — End: 2023-06-03

## 2022-10-31 MED ORDER — ROPINIROLE 1 MG TABLET
1.0000 mg | ORAL_TABLET | Freq: Every evening | ORAL | 0 refills | Status: AC
Start: 2022-10-31 — End: 2023-01-29

## 2022-10-31 NOTE — Progress Notes (Signed)
[facility]  [address]  [place], [place]  [address]  Phone: [phone] Fax: [phone]    Name: Connor Patterson                       Date of Birth: 1961/09/06   MRN:  [id number]                         Date of visit: 10/31/2022     Chief Complaint: Follow Up 3 Months (No new problems.)    Current Outpatient Medications   Medication Sig    allopurinoL (ZYLOPRIM) 300 mg Oral Tablet Take 1 Tablet (300 mg total) by mouth Once a day for 180 days    ascorbic acid, vitamin C, (VITAMIN C) 500 mg Oral Tablet Take 1 Tablet (500 mg total) by mouth    aspirin (ECOTRIN) 325 mg Oral Tablet, Delayed Release (E.C.) Take 1 Tablet (325 mg total) by mouth    BREO ELLIPTA 100-25 mcg/dose Inhalation Disk with Device Take 1 INHALATION  by inhalation Once a day    chlorhexidine gluconate (PERIDEX) 0.12 % Mucous Membrane Mouthwash 15 mL    chlorTHALIDONE (HYGROTON) 25 mg Oral Tablet Take 1 tablet by mouth once daily    cyanocobalamin (VITAMIN B 12) 1,000 mcg Oral Tablet Take 1 Tablet (1,000 mcg total) by mouth    cyclobenzaprine (FLEXERIL) 10 mg Oral Tablet Take 1 Tablet (10 mg total) by mouth Every night    famotidine (PEPCID) 20 mg Oral Tablet Take 1 Tablet (20 mg total) by mouth    furosemide (LASIX) 20 mg Oral Tablet Take 1 Tablet (20 mg total) by mouth    glipiZIDE (GLUCOTROL) 10 mg Oral Tablet Take 1 Tablet (10 mg total) by mouth Once a day for 180 days    ketoconazole (NIZORAL) 2 % Cream Apply 1 Application  topically Twice daily    L.acid/L.casei/B.bif/B.lon/FOS (PROBIOTIC BLEND ORAL) Take 1 Capsule by mouth    levothyroxine (SYNTHROID) 100 mcg Oral Tablet Take 1 tablet by mouth once daily    magnesium oxide (MAG-OX) 400 mg Oral Tablet Take 1 Tablet (400 mg total) by mouth    mometasone (NASONEX) 50 mcg/actuation Nasal Spray, Non-Aerosol 1 Spray    mupirocin (BACTROBAN) 2 % Ointment Apply 1 Application  topically Three times a day    nystatin (MYCOSTATIN) 100,000 unit/mL Oral Suspension     ondansetron (ZOFRAN) 8  mg Oral Tablet Take 1 Tablet (8 mg total) by mouth    pentoxifylline (TRENTAL) 400 mg Oral Tablet Sustained Release Take 1 Tablet (400 mg total) by mouth    polyethylene glycol (MIRALAX) 17 gram/dose Oral Powder Take 3 teaspoons (17 g total) by mouth    potassium chloride (K-TAB) 20 mEq Oral Tablet Sustained Release Take by mouth Once a day    SALIVA STIMULANT COMB. NO.7 MM Rinse mouth daily as needed for dry mouth    semaglutide 3 mg Oral Tablet Take 1 Tablet (3 mg total) by mouth Once a day    spironolactone (ALDACTONE) 100 mg Oral Tablet Take 1 tablet by mouth once daily    triamcinolone acetonide 0.1 % Ointment APPLY OINTMENT TOPICALLY TO AFFECTED AREA TWICE DAILY (2.4M PER APPLICATION)    trimethoprim-sulfamethoxazole (BACTRIM DS) 160-871m per tablet Take 1 Tablet (160 mg total) by mouth Once a day    tumeric-ging-olive-oreg-capryl 100 mg-150 mg- 50 mg-150 mg Oral Capsule Take by mouth 500 mg    vitamin E 400  unit Oral Capsule Take 1 Capsule (400 Units total) by mouth       Patient Active Problem List    Diagnosis Date Noted    Bisphosphonate-associated osteonecrosis of the jaw (CMS [place])  07/19/2022    Benign hypertension 04/08/2022    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/08/2022    Type 2 diabetes mellitus without complication, without long-term current use of insulin (CMS HCC) 04/08/2022    Multiple myeloma in remission (CMS [place]) 04/08/2022    Mixed hyperlipidemia 04/08/2022    Acquired hypothyroidism 04/08/2022    TIA (transient ischemic attack) 04/08/2022    Gout 04/08/2022    Chronic nausea 04/08/2022    Osteoarthritis 04/08/2022    Chronic low back pain 04/08/2022    Bilateral sciatica 04/08/2022    Mild intermittent asthma without complication 69/62/9528    Peripheral vascular disease (CMS [place]) 04/08/2022    Gastroesophageal reflux disease without esophagitis 04/08/2022    Stage 3a chronic kidney disease (CMS [place]) 04/08/2022    Localized edema 04/08/2022    Statin intolerance 04/08/2022       Subjective:    Patient is here for CDM visit.    Did not obtain labs before this visit.     Notes more nausea. Asking if we can change the Rybelsus.     Hypertension  W/ Hx of renal artery stenosis  Following with nephrology  Elevated today    Diabetes mellitus type 2  Currently on glipizide and Rybelsus  Did not tolerate DPP4.  Not a candidate for metformin. Did not tolerate SGLT2 2/2 side pain.  Labs on 02-04-2019 showed hemoglobin A1c was 10.2.  Glipizide was added at that time  Labs on 05-24-2019 showed hemoglobin A1c 9.9  Patient resistant to starting insulin  09-2019 working on diet. has lost weight.  Labs on 09-06-2019 showed hemoglobin A1c 12.0, micro albumin creatnine ratio <30   * Suggested start Insulin, but patient only will accept oral agents  Labs on 12-13-2019 showed hemoglobin A1c 12.6, microalbumin creatinine ratio less than 30  03-2020 Samples of Jardiance given to patient. Tolerarated so increased to max oral dose.  06-2020 D/C Jardiance due to side pain  Labs on 06-08-2020 showed hemoglobin A1c 8.9   * Started Rybelsus  09-2020 Log 85-191  Labs on 12-15-2020 showed hemoglobin A1c 6.9  Labs on 03-16-2021 showed hemoglobin A1c 7.3  Labs on 09-10-2021 showed hemoglobin A1c 6.8  Labs on 01-07-2022 showed hemoglobin A1c 8.0  Labs on 06/26/2022 showed hemoglobin A1c 7.0, microalbumin creatinine ratio less than 30    Multiple myeloma  Lambda light change, w/ renal disease  s/p autologous stem cell transplant, thalidomide and aredia chemotherapy  In Remission, was on Ninlaro maintenance  02-23-2020 No changes  05-17-2020 No changes.  06-14-2020 No changes  12-2020 Dr Herd has resumed care as they could not find anyone to cover. Patient will not need Ninlaro this year.  [phone] Saw Dr Herd. He is definitely retiring. Will be seeing Dr Jeanice Lim.  [phone] Saw Dr Roxanne Mins. No changes.  [phone] Saw Dr Roxanne Mins. No changes. Will see [facility] tomorrow.  01-2022 Bone Scan 1. Negative exam for fractures, lytic or blastic lesions in the  skull, spine, ribs, pelvis, humeri and femora.  2. 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  07-2022 no changes    Mixed hyperlipidemia  Does not tolerate statins except for Livalo.  Insurance will not cover Livalo  Have tried all available non-statin oral agents, and all  of proven ineffective  Does not meet criteria for injectables  Labs on 02-04-2019 showed total cholesterol 244, triglycerides 280, HDL 30, direct LDL 132  Labs on 05-24-2019 showed total cholesterol 273, triglycerides 701, HDL 25, direct LDL 117  Labs on 09-06-2019 showed total cholesterol 203, triglycerides 324, HDL 33, direct LDL 17  Labs on 12-13-2019 showed total cholesterol 212, triglycerides 217, HDL 32, direct LDL 144  Labs on 06-08-2020 showed total cholesterol 227, triglycerides 179, HDL 34, direct LDL 160  08-2020 Dr Bosie Helper resume Zetia despite lack of efficacy in prior attempt  Labs on 12-15-2020 showed total cholesterol 179, triglycerides 125, HDL 38, direct LDL 114  Labs on 03-16-2021 showed total cholesterol 192, triglycerides 102, HDL 47, calculated LDL 125  Labs on 07-04-2021 showed total cholesterol 210, triglycerides 130, HDL 38, direct LDL 152  Labs on 01-07-2022 showed total cholesterol 230, triglycerides 206, HDL 37,   Labs on 06/26/2022 showed total cholesterol 228, triglycerides 271, HDL 34, direct LDL 170    Hypothyroidism  On Synthroid 100 mcg  Labs on 02-04-2019 showed TSH 1.58 on Synthroid 100 mcg  Labs on 05-24-2019 showed TSH 3.83 on Synthroid 100 mcg  Labs on 09-06-2019 showed TSH 0.90 on Synthroid 100 mcg  Labs on 12-13-2019 showed TSH 1.28 on Synthroid 100 mcg  Labs on 06-08-2020 showed TSH 1.53 on Synthroid 100 mcg  Labs on 12-15-2020 showed TSH 3.46 on Synthroid 100 mcg  Labs on 03-16-2021 showed TSH 3.05 on Synthroid 100 mcg  Labs on 07-04-2021 showed TSH 3.05 on Synthroid 100 mcg  Labs on 01-07-2022 showed TSH 0.81 on Synthroid 100 mcg    TIA  Occurred 06-2020  On  aspirin and Zetia  Follow with Dr. Ezzard Flax    Gout  No flares since last visit  Labs on 09-06-2019 showed uric acid 5.8  Labs on 06-08-2020 showed uric acid 5.6  Labs on 12-15-2020 showed uric acid 5.8  Labs on 03-16-2021 showed uric acid 5.0  Labs on 07-04-2021 showed uric acid 4.7    Chronic nausea  Most likely secondary to chemotherapy    Osteoarthritis/Low Back Pain/Sciatica  Known Disc Degeneration, had a spinal block about 6 years ago. Dr Gardiner Coins.  Previously on hydrocodone from oncology for pain associated with multiple myeloma.  Has since been weaned off  Currently on Flexeril  Resumed course of physical therapy  09-2019 on codeine from Dr Enriqueta Shutter  12-2019 Going to Dr Harl Bowie in [facility] for consultation on 27th. He had an initial interview with NP last month. Suspects will have surgery  01-27-2020 Had surgery with Dr Harl Bowie. Doing well. Is having physical therapy with Dr Romero Liner.  [phone] Knee pain has improved. Still having popping and cracking.    Asthma  Following with pulmonology    Edema  On lasix    Peripheral vascular disease  Doing well with pentoxifylline    GERD  Doing well with Pepcid    Chronic kidney disease stage III  Labs on 09-06-2019 showed BUN 37, creatinine 1.5, GFR 51  Labs on 12-13-2019 showed BUN 23, creatinine 1.3, GFR greater than 60  Labs on 06-10-2020 showed BUN 20, creatinine 1.4, GFR 55  Labs on 12-15-2020 showed BUN 28, creatinine 1.3, GFR greater than 60  Labs on 03-16-2021 showed BUN 28, creatinine 1.3, GFR greater than 60  Labs on 09-10-2021 showed BUN 31, creatinine 1.5, GFR 51  Labs on 01-07-2022 showed BUN 24, creatinine 1.2, GFR greater than 60  Labs on 06/26/2022 showed  BUN 32, creatinine 1.47, GFR 54    ROS:  10 systems reviewed and were negative except as noted.   + edema  + nausea  + abnormal gait, back pain, joint pain, muscle cramps, muscle weakness, numbness, tingling    Objective :  BP (!) 146/82 (Site: Left, Patient Position: Sitting, Cuff Size: Adult)   Pulse  73   Resp 18   Ht 1.803 m (_0 )   Wt 98.5 kg (217 lb 4 oz)   SpO2 99%   BMI 30.30 kg/m       General:  appears chronically ill and moderately obese  Lungs:  Clear to auscultation bilaterally.   Cardiovascular:  regular rate and rhythm  Neurologic:  Grossly normal  Psychiatric:  Normal    Data reviewed:      Assessment/Plan:  Assessment/Plan   1. Benign hypertension    2. Mixed hyperlipidemia    3. Type 2 diabetes mellitus without complication, without long-term current use of insulin (CMS HCC)    4. Peripheral vascular disease (CMS [place])    5. Renal artery stenosis (CMS HCC)    6. Mild intermittent asthma without complication    7. 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 Gastroesophageal reflux disease without esophagitis    9. Chronic nausea    10. Acquired hypothyroidism    11. 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)    12. Bilateral sciatica    13. Osteoarthritis, unspecified osteoarthritis type, unspecified site    14. Gout, unspecified cause, unspecified chronicity, unspecified site    15. Chronic bilateral low back pain with bilateral sciatica    16. 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 May be able to adjust labs.   He does not like diabetic shoes. Actually caused more foot pain.     Hypertension  Continue diuretics  Follow with nephrology    Multiple myeloma  In remission  Follow with oncology    Mixed hyperlipidemia  Monitor    Hypothyroidism  Continue Synthroid    Nausea  Continue Zofran    Arthritis/back pain/sciatica  Continue Flexeril  Follow with Neurosurgery    Gout  Continue allopurinol    Asthma  Continue follow-up with pulmonology    Chronic edema  Continue diuretics    GERD  Continue Pepcid    Chronic disease stage III  Monitor    TIA  Follow with neurology    Restless leg  Try requip    Casimer Lanius, DO, [facility]  Internal Medicine

## 2022-10-31 NOTE — Nursing Note (Signed)
Patient is here for his follow up. Patient reports no new problems.

## 2022-11-01 ENCOUNTER — Telehealth (RURAL_HEALTH_CENTER): Payer: Self-pay | Admitting: INTERNAL MEDICINE

## 2022-11-01 NOTE — Telephone Encounter (Signed)
-----   Message from [facility], DO sent at 11/01/2022  8:27 AM EST -----  A1c 6.5, so he can try stopping the Rybelsus and see how blood sugars go. Ideally, he won't see any major jumps but if he does please let me know  Kidney function is good.  Blood counts look ok.   Cholesterol still high, but not much else we can offer right now for that

## 2022-11-01 NOTE — Telephone Encounter (Signed)
Pt was notified of results

## 2022-11-02 LAB — LDL CHOLESTEROL, DIRECT: LDL DIRECT: 170 mg/dL — ABNORMAL HIGH (ref <100)

## 2022-11-11 ENCOUNTER — Ambulatory Visit (HOSPITAL_COMMUNITY): Payer: Self-pay | Admitting: HEMATOLOGY-ONCOLOGY

## 2022-11-18 ENCOUNTER — Inpatient Hospital Stay (HOSPITAL_COMMUNITY)
Admission: RE | Admit: 2022-11-18 | Discharge: 2022-11-18 | Disposition: A | Discharge status: Home / Self Care | Payer: Medicare Other | Source: Ambulatory Visit | Attending: HEMATOLOGY-ONCOLOGY | Admitting: HEMATOLOGY-ONCOLOGY

## 2022-11-18 ENCOUNTER — Other Ambulatory Visit: Payer: Self-pay

## 2022-11-18 ENCOUNTER — Ambulatory Visit: Payer: Medicare Other | Attending: HEMATOLOGY-ONCOLOGY | Admitting: HEMATOLOGY-ONCOLOGY

## 2022-11-18 ENCOUNTER — Other Ambulatory Visit (HOSPITAL_COMMUNITY): Payer: Medicare Other

## 2022-11-18 ENCOUNTER — Encounter (HOSPITAL_COMMUNITY): Payer: Self-pay | Admitting: HEMATOLOGY-ONCOLOGY

## 2022-11-18 VITALS — BP 164/70 | HR 94 | Temp 96.5°F | Ht 71.0 in | Wt 221.8 lb

## 2022-11-18 DIAGNOSIS — C9000 Multiple myeloma not having achieved remission: Secondary | ICD-10-CM

## 2022-11-18 DIAGNOSIS — M25552 Pain in left hip: Secondary | ICD-10-CM | POA: Insufficient documentation

## 2022-11-18 DIAGNOSIS — Z807 Family history of other malignant neoplasms of lymphoid, hematopoietic and related tissues: Secondary | ICD-10-CM | POA: Insufficient documentation

## 2022-11-18 DIAGNOSIS — M879 Osteonecrosis, unspecified: Secondary | ICD-10-CM | POA: Insufficient documentation

## 2022-11-18 DIAGNOSIS — Z9484 Stem cells transplant status: Secondary | ICD-10-CM | POA: Insufficient documentation

## 2022-11-18 DIAGNOSIS — C9001 Multiple myeloma in remission: Secondary | ICD-10-CM | POA: Insufficient documentation

## 2022-11-18 DIAGNOSIS — E039 Hypothyroidism, unspecified: Secondary | ICD-10-CM

## 2022-11-18 LAB — CBC WITH DIFF
BASOPHIL #: 0.1 10*3/uL (ref 0.00–0.10)
BASOPHIL %: 1 % (ref 0–1)
EOSINOPHIL #: 0.1 10*3/uL (ref 0.00–0.50)
EOSINOPHIL %: 1 %
HCT: 39.3 % (ref 36.7–47.1)
HGB: 13.5 g/dL (ref 12.5–16.3)
LYMPHOCYTE #: 2.2 10*3/uL (ref 1.00–3.00)
LYMPHOCYTE %: 31 % (ref 16–44)
MCH: 35.2 pg — ABNORMAL HIGH (ref 23.8–33.4)
MCHC: 34.4 g/dL (ref 32.5–36.3)
MCV: 102.1 fL — ABNORMAL HIGH (ref 73.0–96.2)
MONOCYTE #: 0.6 10*3/uL (ref 0.30–1.00)
MONOCYTE %: 8 % (ref 5–13)
MPV: 8.1 fL (ref 7.4–11.4)
NEUTROPHIL #: 4.1 10*3/uL (ref 1.85–7.80)
NEUTROPHIL %: 58 % (ref 43–77)
PLATELETS: 199 10*3/uL (ref 140–440)
RBC: 3.85 10*6/uL — ABNORMAL LOW (ref 4.06–5.63)
RDW: 13.9 % (ref 12.1–16.2)
WBC: 7 10*3/uL (ref 3.6–10.2)

## 2022-11-18 LAB — COMPREHENSIVE METABOLIC PANEL, NON-FASTING
ALBUMIN/GLOBULIN RATIO: 1.3 (ref 0.8–1.4)
ALBUMIN: 3.9 g/dL (ref 3.5–5.7)
ALKALINE PHOSPHATASE: 87 U/L (ref 34–104)
ALT (SGPT): 19 U/L (ref 7–52)
ANION GAP: 7 mmol/L (ref 4–13)
AST (SGOT): 16 U/L (ref 13–39)
BILIRUBIN TOTAL: 0.3 mg/dL (ref 0.3–1.2)
BUN/CREA RATIO: 19 (ref 6–22)
BUN: 25 mg/dL (ref 7–25)
CALCIUM, CORRECTED: 9.8 mg/dL (ref 8.9–10.8)
CALCIUM: 9.7 mg/dL (ref 8.6–10.3)
CHLORIDE: 106 mmol/L (ref 98–107)
CO2 TOTAL: 25 mmol/L (ref 21–31)
CREATININE: 1.34 mg/dL — ABNORMAL HIGH (ref 0.60–1.30)
ESTIMATED GFR: 60 mL/min/{1.73_m2} (ref >59)
GLOBULIN: 3 (ref 2.9–5.4)
GLUCOSE: 152 mg/dL — ABNORMAL HIGH (ref 74–109)
OSMOLALITY, CALCULATED: 283 mOsm/kg (ref 270–290)
POTASSIUM: 4 mmol/L (ref 3.5–5.1)
PROTEIN TOTAL: 6.9 g/dL (ref 6.4–8.9)
SODIUM: 138 mmol/L (ref 136–145)

## 2022-11-18 LAB — THYROID STIMULATING HORMONE (SENSITIVE TSH): TSH: 0.972 u[IU]/mL (ref 0.450–5.330)

## 2022-11-18 MED ORDER — TRIAMCINOLONE ACETONIDE 0.1 % TOPICAL OINTMENT
TOPICAL_OINTMENT | CUTANEOUS | 0 refills | Status: DC
Start: 2022-11-18 — End: 2022-12-26

## 2022-11-18 NOTE — Progress Notes (Unsigned)
Department of Hematology/Oncology  Progress Note   Name: Connor Patterson  UYE:[id number]  Date of Birth: 11/23/1961  Encounter Date: 11/18/2022    REFERRING PROVIDER:  Casimer Lanius, [place]  [place],  [place] [address]     TELEMEDICINE DOCUMENTATION:  Patient Location:  [facility], [facility] outpatient Hematology/Oncology [address] [place] [address]  Patient/family aware of provider location:  yes  Patient/family consent for telemedicine:  yes    REASON FOR OFFICE VISIT:  Multiple Myeloma     HISTORY OF PRESENT ILLNESS:  Connor Patterson is a 61 y.o. male who presents today for follow up of multiple myeloma.  He was originally diagnosed in January 2004, and at that time had renal failure and a respiratory infection his serum electrophoresis showed a monoclonal protein of 0.1 grams/deciliter.  His bone marrow biopsy at that time was consistent with the presence of multiple myeloma, and a skeletal survey showed numerous lytic lesions and he also had an elevated calcium level.     He was being seen at [facility] and had VAD chemotherapy for 4 cycles.  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  He was treated with thalidomide in a maintenance fashion.  He developed osteonecrosis of the jaw in 2013, and his bisphosphonate therapy was discontinued at that time.  He was apparently switch to Revlimid for maintenance treatment, and it was discontinued in 2017 due to poor tolerance.    Since 2018, the patient has been off of any maintenance therapy.  His monoclonal protein levels have been very low and he has been considered to still be in remission.  He is being followed with observation and continues to follow up at [facility].    11/18/2022: The patient is here for follow up of multiple myeloma.  He has been off of maintenance therapy and is having his labs rechecked every 6 months.  He started having some problems in his left hip recently.  He states that he is  started playing trumpet again and has been participating in some Christmas cancers.  This has resulted in him standing for prolonged periods of time and shifting his weight back and forth.  He also had some laboratory studies checked a couple of weeks ago although myeloma labs were not checked at that time.    ROS:   Pertinent review of systems as discussed in HPI    HISTORY:  Past Medical History:   Diagnosis Date    Asthma     Diabetes mellitus, type 2 (CMS HCC)     Essential hypertension     Hx of transfusion          Past Surgical History:   Procedure Laterality Date    HX BACK SURGERY      3 times    HX COLONOSCOPY      LIMBAL STEM CELL TRANSPLANT      RENAL BIOPSY, OPEN           Social History     Socioeconomic History    Marital status: Married     Spouse name: Connor Patterson    Number of children: 2    Years of education: Not on file    Highest education level: Not on file   Occupational History    Not on file   Tobacco Use    Smoking status: Never    Smokeless tobacco: Former   Brewing technologist Use: Never used  Substance and Sexual Activity    Alcohol use: Never    Drug use: Never    Sexual activity: Not on file   Other Topics Concern    Not on file   Social History Narrative    Not on file     Social Determinants of Health     Financial Resource Strain: Not on file   Transportation Needs: Not on file   Social Connections: Not on file   Intimate Partner Violence: Not on file   Housing Stability: Not on file     Family Medical History:       Problem Relation (Age of Onset)    Autoimmune disease Mother    Carotid Stenosis Mother    Hodgkin's lymphoma Father            Current Outpatient Medications   Medication Sig    allopurinoL (ZYLOPRIM) 300 mg Oral Tablet Take 1 Tablet (300 mg total) by mouth Once a day for 180 days    ascorbic acid, vitamin C, (VITAMIN C) 500 mg Oral Tablet Take 1 Tablet (500 mg total) by mouth    aspirin (ECOTRIN) 325 mg Oral Tablet, Delayed Release (E.C.) Take 1 Tablet (325 mg total) by  mouth    BREO ELLIPTA 100-25 mcg/dose Inhalation Disk with Device Take 1 INHALATION  by inhalation Once a day    chlorhexidine gluconate (PERIDEX) 0.12 % Mucous Membrane Mouthwash 15 mL    chlorTHALIDONE (HYGROTON) 25 mg Oral Tablet Take 1 tablet by mouth once daily    cyanocobalamin (VITAMIN B 12) 1,000 mcg Oral Tablet Take 1 Tablet (1,000 mcg total) by mouth    cyclobenzaprine (FLEXERIL) 10 mg Oral Tablet Take 1 Tablet (10 mg total) by mouth Every night    famotidine (PEPCID) 20 mg Oral Tablet Take 1 Tablet (20 mg total) by mouth    furosemide (LASIX) 20 mg Oral Tablet Take 1 Tablet (20 mg total) by mouth    glipiZIDE (GLUCOTROL) 10 mg Oral Tablet Take 1 Tablet (10 mg total) by mouth Once a day for 180 days    ketoconazole (NIZORAL) 2 % Cream Apply 1 Application  topically Twice daily    L.acid/L.casei/B.bif/B.lon/FOS (PROBIOTIC BLEND ORAL) Take 1 Capsule by mouth    levothyroxine (SYNTHROID) 100 mcg Oral Tablet Take 1 tablet by mouth once daily    magnesium oxide (MAG-OX) 400 mg Oral Tablet Take 1 Tablet (400 mg total) by mouth    mometasone (NASONEX) 50 mcg/actuation Nasal Spray, Non-Aerosol 1 Spray    mupirocin (BACTROBAN) 2 % Ointment Apply 1 Application  topically Three times a day    nystatin (MYCOSTATIN) 100,000 unit/mL Oral Suspension     ondansetron (ZOFRAN) 8 mg Oral Tablet Take 1 Tablet (8 mg total) by mouth    pentoxifylline (TRENTAL) 400 mg Oral Tablet Sustained Release Take 1 Tablet (400 mg total) by mouth    polyethylene glycol (MIRALAX) 17 gram/dose Oral Powder Take 3 teaspoons (17 g total) by mouth    potassium chloride (K-TAB) 20 mEq Oral Tablet Sustained Release Take by mouth Once a day    rOPINIRole (REQUIP) 1 mg Oral Tablet Take 1 Tablet (1 mg total) by mouth Every night for 90 days    SALIVA STIMULANT COMB. NO.7 MM Rinse mouth daily as needed for dry mouth    semaglutide 3 mg Oral Tablet Take 1 Tablet (3 mg total) by mouth Once a day    spironolactone (ALDACTONE) 100 mg Oral Tablet Take 1  tablet by mouth  once daily    triamcinolone acetonide 0.1 % Ointment APPLY OINTMENT TOPICALLY TO AFFECTED AREA TWICE DAILY (1.8H PER APPLICATION)    trimethoprim-sulfamethoxazole (BACTRIM DS) 160-846m per tablet Take 1 Tablet (160 mg total) by mouth Once a day    tumeric-ging-olive-oreg-capryl 100 mg-150 mg- 50 mg-150 mg Oral Capsule Take by mouth 500 mg    vitamin E 400 unit Oral Capsule Take 1 Capsule (400 Units total) by mouth     No Known Allergies    PHYSICAL EXAM:  Most Recent Vitals    Flowsheet Row Telemedicine from 03/25/2022 in Hematology/Oncology,   [facility]  Temperature 36.3 C (97.4 F) filed at... 03/25/2022 0916   Heart Rate 86 filed at... 03/25/2022 0916   Respiratory Rate --   BP (Non-Invasive) 139/68 filed at... 03/25/2022 0916   SpO2 --   Height 1.803 m (_0 ) filed at... 03/25/2022 0916   Weight 105 kg (231 lb 11.2 oz) filed at... 03/25/2022 0916   BMI (Calculated) 32.38 filed at... 03/25/2022 0916   BSA (Calculated) 2.29 filed at... 03/25/2022 0916      ECOG Status: (0) Fully active, able to carry on all predisease performance without restriction   Physical Exam  Vitals reviewed.   Constitutional:       Appearance: He is obese.   Eyes:      Pupils: Pupils are equal, round, and reactive to light.   Cardiovascular:      Rate and Rhythm: Normal rate and regular rhythm.      Heart sounds: Normal heart sounds. No murmur heard.  Pulmonary:      Effort: Pulmonary effort is normal.   Musculoskeletal:         General: Normal range of motion.      Cervical back: Normal range of motion.   Lymphadenopathy:      Head:      Right side of head: No submental, submandibular, tonsillar, preauricular, posterior auricular or occipital adenopathy.      Left side of head: No submental, submandibular, tonsillar, preauricular, posterior auricular or occipital adenopathy.      Cervical: No cervical adenopathy.      Right cervical: No superficial, deep or posterior cervical adenopathy.     Left  cervical: No superficial, deep or posterior cervical adenopathy.      Upper Body:      Right upper body: No supraclavicular or axillary adenopathy.      Left upper body: No supraclavicular or axillary adenopathy.      Lower Body: No right inguinal adenopathy. No left inguinal adenopathy.   Skin:     General: Skin is warm and dry.      Capillary Refill: Capillary refill takes less than 2 seconds.   Neurological:      General: No focal deficit present.      Mental Status: He is alert and oriented to person, place, and time. Mental status is at baseline.      Motor: Motor function is intact.      Coordination: Coordination is intact.      Gait: Gait is intact.   Psychiatric:         Attention and Perception: Attention normal.         Mood and Affect: Mood and affect normal.         Speech: Speech normal.         Behavior: Behavior normal.         Thought Content: Thought content normal.  Cognition and Memory: Cognition normal.         Judgment: Judgment normal.         DIAGNOSTIC DATA:  No results found for this or any previous visit (from the past 17520 hour(s)).    LABS:   CBC  Diff   Lab Results   Component Value Date/Time    WBC 7.2 10/31/2022 11:29 AM    HGB 14.3 10/31/2022 11:29 AM    HCT 41.9 10/31/2022 11:29 AM    PLTCNT 220 10/31/2022 11:29 AM    RBC 4.06 10/31/2022 11:29 AM    MCV 103.1 (H) 10/31/2022 11:29 AM    MCHC 34.2 10/31/2022 11:29 AM    MCH 35.2 (H) 10/31/2022 11:29 AM    RDW 14.0 10/31/2022 11:29 AM    MPV 9.4 10/31/2022 11:29 AM    Lab Results   Component Value Date/Time    PMNS 60 10/31/2022 11:29 AM    LYMPHOCYTES 27 10/31/2022 11:29 AM    EOSINOPHIL 1 10/31/2022 11:29 AM    MONOCYTES 12 10/31/2022 11:29 AM    BASOPHILS 1 10/31/2022 11:29 AM    BASOPHILS 0.00 10/31/2022 11:29 AM    PMNABS 4.30 10/31/2022 11:29 AM    LYMPHSABS 1.90 10/31/2022 11:29 AM    EOSABS 0.10 10/31/2022 11:29 AM    MONOSABS 0.80 10/31/2022 11:29 AM            Comprehensive Metabolic Profile    Lab Results   Component  Value Date    SODIUM 136 10/31/2022    POTASSIUM 3.6 10/31/2022    CHLORIDE 101 10/31/2022    CO2 26 10/31/2022    ANIONGAP 9 10/31/2022    BUN 27 (H) 10/31/2022    CREATININE 1.26 10/31/2022    ALBUMIN 4.2 10/31/2022    CALCIUM 9.7 10/31/2022    GLUCOSENF 139 (H) 10/31/2022    ALKPHOS 93 10/31/2022    ALT 20 10/31/2022    AST 19 10/31/2022    TOTBILIRUBIN 0.5 10/31/2022    TOTALPROTEIN 7.1 10/31/2022          BASIC METABOLIC PANEL  Lab Results   Component Value Date    SODIUM 136 10/31/2022    POTASSIUM 3.6 10/31/2022    CHLORIDE 101 10/31/2022    CO2 26 10/31/2022    ANIONGAP 9 10/31/2022    BUN 27 (H) 10/31/2022    CREATININE 1.26 10/31/2022    BUNCRRATIO 21 10/31/2022    GFR 65 10/31/2022    CALCIUM 9.7 10/31/2022    GLUCOSENF 139 (H) 10/31/2022           ASSESSMENT:  Problem List Items Addressed This Visit    None  Visit Diagnoses       Multiple myeloma, remission status unspecified (CMS HCC)    -  Primary    Relevant Orders    MONOCLONAL GAMMOPATHY PROFILE WITH SPEP, FLC, AND IMMUNOTYPING REFLEX    XR HIP LEFT    COMPREHENSIVE METABOLIC PANEL, NON-FASTING    CBC/DIFF    MONOCLONAL GAMMOPATHY PROFILE WITH SPEP, FLC, AND IMMUNOTYPING REFLEX             ICD-10-CM    1. Multiple myeloma, remission status unspecified (CMS HCC)  C90.00 MONOCLONAL GAMMOPATHY PROFILE WITH SPEP, FLC, AND IMMUNOTYPING REFLEX     XR HIP LEFT     COMPREHENSIVE METABOLIC PANEL, NON-FASTING     CBC/DIFF     MONOCLONAL GAMMOPATHY PROFILE WITH SPEP, FLC, AND IMMUNOTYPING REFLEX  PLAN:   1. All relevant medical records were reviewed including available pertinent provider notes, procedure notes, imaging, laboratory, and pathology.   2. All pertinent labs and/or imaging were reviewed with the patient.   3. Multiple myeloma:  The patient is status post VAD induction, stem cell transplant, thalidomide maintenance followed by lenalidomide maintenance.  He has been on observation since 2018 and has not had any evidence of recurrence since  that time.  We are basically following him as an [facility] patient, and we will recheck laboratory studies every 6 months.  I will go ahead and order myeloma specific laboratories at this time, and arrange for an x-ray of the left hip.  He is to call us at the end of the week to go over results.  4. Osteonecrosis of the jaw:  No further bisphosphonate therapy is planned.    Polly Cobia was given the chance to ask questions, and these were answered to their satisfaction. The patient is welcome to call with any questions or concerns in the meantime.       On the day of the encounter, a total of 35 minutes was spent on this patient encounter including review of historical information, examination, documentation and post-visit activities.   Return in about 6 months (around 05/20/2023).     Narda Rutherford, MD  11/18/2022, 10:33  The patient was seen as part of a collaborative telemedicine service with Dr. Jake Shark who participated in the encounter by active presence via approved video/audio means for portions of the encounter.  The patient's insurance company bears full legal and financial responsibility resulting from any deviations that they cause to my recommended treatment plan.   CC:  Gabriel Rainwater Davenport, DO  [address]  [place] [address]    Maurice Small Hoople, DO  [place]  [place],  [place] [address]    This note was partially generated using MModal Fluency Direct system, and there may be some incorrect words, spellings, and punctuation that were not noted in checking the note before saving.

## 2022-11-19 LAB — KAPPA AND LAMBDA FREE LIGHT CHAINS, SERUM
KAPPA FREE LIGHT CHAINS: 2.7 mg/dL — ABNORMAL HIGH (ref 0.33–1.94)
KAPPA/LAMBDA FLC RATIO: 1.16 (ref 0.26–1.65)
LAMBDA FREE LIGHT CHAINS: 2.32 mg/dL (ref 0.57–2.63)

## 2022-11-19 LAB — PROTEIN FOR ELECTROPHORESIS: PROTEIN TOTAL: 6.2 g/dL (ref 5.6–7.6)

## 2022-11-19 LAB — ALBUMIN FOR ELECTROPHORESIS: ALBUMIN: 3.6 g/dL (ref 3.4–4.8)

## 2022-11-20 LAB — MONOCLONAL GAMMOPATHY PROFILE WITH IMMUNOTYPING REFLEX
ALBUMIN: 3.6 g/dL (ref 3.4–4.8)
KAPPA FREE LIGHT CHAINS: 2.7 mg/dL — ABNORMAL HIGH (ref 0.33–1.94)
KAPPA/LAMBDA FLC RATIO: 1.16 (ref 0.26–1.65)
LAMBDA FREE LIGHT CHAINS: 2.32 mg/dL (ref 0.57–2.63)
TOTAL PROTEIN: 6.2 g/dL (ref 5.6–7.6)

## 2022-11-21 ENCOUNTER — Other Ambulatory Visit (RURAL_HEALTH_CENTER): Payer: Self-pay | Admitting: INTERNAL MEDICINE

## 2022-11-21 ENCOUNTER — Telehealth (RURAL_HEALTH_CENTER): Payer: Self-pay | Admitting: INTERNAL MEDICINE

## 2022-11-21 MED ORDER — CYCLOBENZAPRINE 10 MG TABLET
10.0000 mg | ORAL_TABLET | Freq: Every evening | ORAL | 1 refills | Status: DC
Start: 2022-11-21 — End: 2023-01-31

## 2022-11-21 NOTE — Telephone Encounter (Signed)
Patient requesting refill on Flexeril.

## 2022-12-03 ENCOUNTER — Ambulatory Visit (RURAL_HEALTH_CENTER): Payer: Medicare Other | Attending: INTERNAL MEDICINE | Admitting: INTERNAL MEDICINE

## 2022-12-03 ENCOUNTER — Other Ambulatory Visit: Payer: Self-pay

## 2022-12-03 ENCOUNTER — Encounter (RURAL_HEALTH_CENTER): Payer: Self-pay | Admitting: INTERNAL MEDICINE

## 2022-12-03 VITALS — BP 127/69 | HR 83 | Resp 18 | Ht 71.0 in | Wt 226.5 lb

## 2022-12-03 DIAGNOSIS — M5431 Sciatica, right side: Secondary | ICD-10-CM | POA: Insufficient documentation

## 2022-12-03 DIAGNOSIS — M5416 Radiculopathy, lumbar region: Secondary | ICD-10-CM | POA: Insufficient documentation

## 2022-12-03 DIAGNOSIS — M5442 Lumbago with sciatica, left side: Secondary | ICD-10-CM | POA: Insufficient documentation

## 2022-12-03 DIAGNOSIS — G8929 Other chronic pain: Secondary | ICD-10-CM

## 2022-12-03 DIAGNOSIS — M5441 Lumbago with sciatica, right side: Secondary | ICD-10-CM | POA: Insufficient documentation

## 2022-12-03 DIAGNOSIS — M5432 Sciatica, left side: Secondary | ICD-10-CM | POA: Insufficient documentation

## 2022-12-03 MED ORDER — BACLOFEN 10 MG TABLET
10.0000 mg | ORAL_TABLET | Freq: Three times a day (TID) | ORAL | 0 refills | Status: AC
Start: 2022-12-03 — End: 2023-01-31

## 2022-12-03 MED ORDER — HYDROCODONE 5 MG-ACETAMINOPHEN 325 MG TABLET
1.0000 | ORAL_TABLET | Freq: Three times a day (TID) | ORAL | 0 refills | Status: AC | PRN
Start: 2022-12-03 — End: 2022-12-10

## 2022-12-03 NOTE — Addendum Note (Signed)
Addended byAlinda Dooms, Asta Corbridge on: 12/03/2022 04:06 PM     Modules accepted: Orders

## 2022-12-03 NOTE — Progress Notes (Signed)
[facility]  [address]  [place], [place]  [address]  Phone: [phone] Fax: [phone]    Name: Connor Patterson                       Date of Birth: [birth year]   MRN:  [id number]                         Date of visit: 12/03/2022     Chief Complaint: Back Pain (C/o lower back pain that radiates down both legs)    Current Outpatient Medications   Medication Sig    allopurinoL (ZYLOPRIM) 300 mg Oral Tablet Take 1 Tablet (300 mg total) by mouth Once a day for 180 days    ascorbic acid, vitamin C, (VITAMIN C) 500 mg Oral Tablet Take 1 Tablet (500 mg total) by mouth    aspirin (ECOTRIN) 325 mg Oral Tablet, Delayed Release (E.C.) Take 1 Tablet (325 mg total) by mouth    baclofen (LIORESAL) 10 mg Oral Tablet Take 1 Tablet (10 mg total) by mouth Three times a day for 30 days    BREO ELLIPTA 100-25 mcg/dose Inhalation Disk with Device Take 1 INHALATION  by inhalation Once a day    chlorhexidine gluconate (PERIDEX) 0.12 % Mucous Membrane Mouthwash 15 mL    chlorTHALIDONE (HYGROTON) 25 mg Oral Tablet Take 1 tablet by mouth once daily    cyanocobalamin (VITAMIN B 12) 1,000 mcg Oral Tablet Take 1 Tablet (1,000 mcg total) by mouth    cyclobenzaprine (FLEXERIL) 10 mg Oral Tablet Take 1 Tablet (10 mg total) by mouth Every night for 180 days    famotidine (PEPCID) 20 mg Oral Tablet Take 1 Tablet (20 mg total) by mouth    furosemide (LASIX) 20 mg Oral Tablet Take 1 Tablet (20 mg total) by mouth    glipiZIDE (GLUCOTROL) 10 mg Oral Tablet Take 1 Tablet (10 mg total) by mouth Once a day for 180 days    ketoconazole (NIZORAL) 2 % Cream Apply 1 Application  topically Twice daily    L.acid/L.casei/B.bif/B.lon/FOS (PROBIOTIC BLEND ORAL) Take 1 Capsule by mouth    levothyroxine (SYNTHROID) 100 mcg Oral Tablet Take 1 tablet by mouth once daily    magnesium oxide (MAG-OX) 400 mg Oral Tablet Take 1 Tablet (400 mg total) by mouth    mometasone (NASONEX) 50 mcg/actuation Nasal Spray, Non-Aerosol 1 Spray    mupirocin (BACTROBAN) 2 %  Ointment Apply 1 Application  topically Three times a day    nystatin (MYCOSTATIN) 100,000 unit/mL Oral Suspension     ondansetron (ZOFRAN) 8 mg Oral Tablet Take 1 Tablet (8 mg total) by mouth    pentoxifylline (TRENTAL) 400 mg Oral Tablet Sustained Release Take 1 Tablet (400 mg total) by mouth    polyethylene glycol (MIRALAX) 17 gram/dose Oral Powder Take 3 teaspoons (17 g total) by mouth    potassium chloride (K-TAB) 20 mEq Oral Tablet Sustained Release Take by mouth Once a day    rOPINIRole (REQUIP) 1 mg Oral Tablet Take 1 Tablet (1 mg total) by mouth Every night for 90 days    SALIVA STIMULANT COMB. NO.7 MM Rinse mouth daily as needed for dry mouth    semaglutide 3 mg Oral Tablet Take 1 Tablet (3 mg total) by mouth Once a day    spironolactone (ALDACTONE) 100 mg Oral Tablet Take 1 tablet by mouth once daily    triamcinolone acetonide 0.1 % Ointment APPLY  OINTMENT TOPICALLY TO AFFECTED AREA TWICE DAILY (5.1O PER APPLICATION)    trimethoprim-sulfamethoxazole (BACTRIM DS) 160-845m per tablet Take 1 Tablet (160 mg total) by mouth Once a day    tumeric-ging-olive-oreg-capryl 100 mg-150 mg- 50 mg-150 mg Oral Capsule Take by mouth 500 mg    vitamin E 400 unit Oral Capsule Take 1 Capsule (400 Units total) by mouth       Patient Active Problem List    Diagnosis Date Noted    Bisphosphonate-associated osteonecrosis of the jaw (CMS [facility]  07/19/2022    Benign hypertension 04/08/2022    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/08/2022    Type 2 diabetes mellitus without complication, without long-term current use of insulin (CMS HCC) 04/08/2022    Multiple myeloma in remission (CMS [facility] 04/08/2022    Mixed hyperlipidemia 04/08/2022    Acquired hypothyroidism 04/08/2022    TIA (transient ischemic attack) 04/08/2022    Gout 04/08/2022    Chronic nausea 04/08/2022    Osteoarthritis 04/08/2022    Chronic low back pain 04/08/2022    Bilateral sciatica 04/08/2022    Mild intermittent asthma without complication 084/16/6063   Peripheral  vascular disease (CMS [place] 04/08/2022    Gastroesophageal reflux disease without esophagitis 04/08/2022    Stage 3a chronic kidney disease (CMS [place] 04/08/2022    Localized edema 04/08/2022    Statin intolerance 04/08/2022       Subjective:   Patient is here for acute visit.    He is complaining of bilateral low back pain.  Started about 3 weeks ago after he played the trumpet for an extended period in a Christmas contata.   Patient has known lumbar spine disease with radiculopathy/sciatica. He had surgery around 2021.   States pain similar to previous herniation.     ROS:  10 systems reviewed and were negative except as noted.   + edema  + nausea  + abnormal gait, back pain, joint pain, muscle cramps, muscle weakness, numbness, tingling    Objective :  BP 127/69 (Site: Left, Patient Position: Sitting, Cuff Size: Adult)   Pulse 83   Resp 18   Ht 1.803 m (_0 )   Wt 103 kg (226 lb 8 oz)   SpO2 97%   BMI 31.59 kg/m       General:  appears chronically ill and moderately obese  Lungs:  Clear to auscultation bilaterally.   Cardiovascular:  regular rate and rhythm  Neurologic:  Grossly normal  Psychiatric:  Normal  + Straight leg raise    Data reviewed:      Assessment/Plan:  Assessment/Plan   1. Chronic bilateral low back pain with bilateral sciatica    2. Bilateral sciatica    3. Lumbar radiculopathy      Switch muscle relaxer to Baclofen  Schedule with therapy  Go ahead and see if we can get MRI. With his history of herniation and new radicular symptoms, I think trying for MRI before the therapy will speed up treatment  [facility]

## 2022-12-03 NOTE — Nursing Note (Signed)
Patient is here with c/o back pain that radiates down both legs. Patient said he has had the pain for about 1 month.

## 2022-12-09 ENCOUNTER — Telehealth (RURAL_HEALTH_CENTER): Payer: Self-pay | Admitting: INTERNAL MEDICINE

## 2022-12-09 ENCOUNTER — Other Ambulatory Visit (RURAL_HEALTH_CENTER): Payer: Self-pay | Admitting: INTERNAL MEDICINE

## 2022-12-09 MED ORDER — LORAZEPAM 1 MG TABLET
1.0000 mg | ORAL_TABLET | Freq: Two times a day (BID) | ORAL | 0 refills | Status: DC | PRN
Start: 2022-12-09 — End: 2023-01-31

## 2022-12-09 NOTE — Telephone Encounter (Signed)
Patient states that he has an MRI scheduled for in the morning and wants to know if you can send in an Ativan for his nerves.

## 2022-12-24 ENCOUNTER — Telehealth (RURAL_HEALTH_CENTER): Payer: Self-pay | Admitting: INTERNAL MEDICINE

## 2022-12-24 IMAGING — MR MRI LUMBAR SPINE WITHOUT AND WITH CONTRAST
8 series · 48 of 48 positions shown · IV contrast (Gadavist)
Comparison: Previous MRI lumbar spine dated 10/26/2019.

﻿EXAM:  57986   MRI LUMBAR SPINE WITHOUT AND WITH CONTRAST
INDICATION: History of chronic low back pain.  Multiple previous back surgeries, most recently in 8989.  Prior history of multiple myeloma, status post chemotherapy, in remission.
TECHNIQUE: Multiplanar, multisequential MRI of the lumbar spine was performed including postcontrast study after 8 mL Gadavist IV.

[Series 5: T2 · sagittal · 4.5mm · 0.94mm/px · 5 of 13 slices shown (1 of 3)]
[im 1/13]
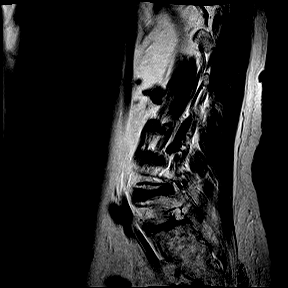
[im 4/13]
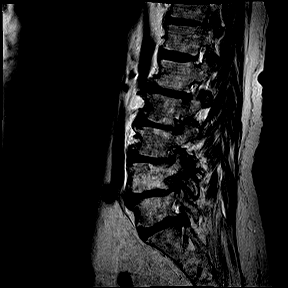
[im 7/13]
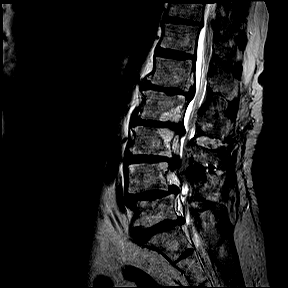
[im 10/13]
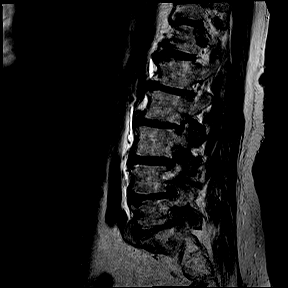
[im 13/13]
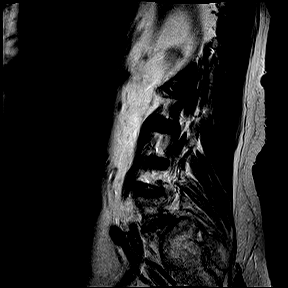

[Series 6: T1 · sagittal · 4.5mm · 0.94mm/px · 5 of 13 slices shown]
[im 1/13]
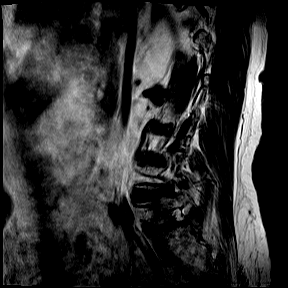
[im 4/13]
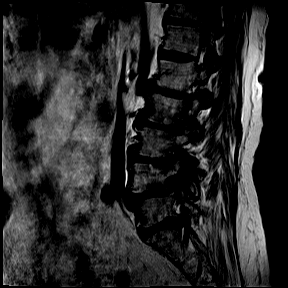
[im 7/13]
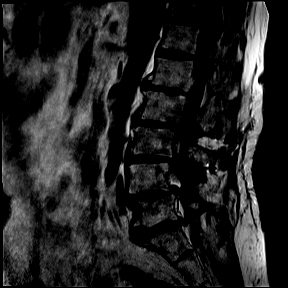
[im 10/13]
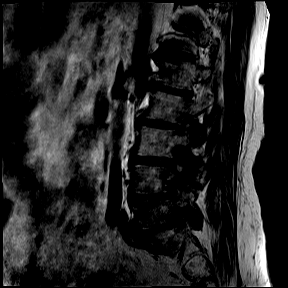
[im 13/13]
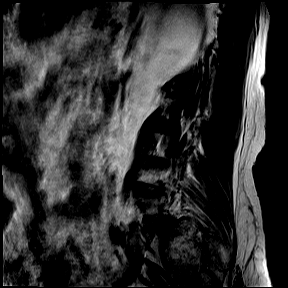

[Series 7: STIR · sagittal · 4.5mm · 1.05mm/px · 4 of 13 slices shown]
[im 1/13]
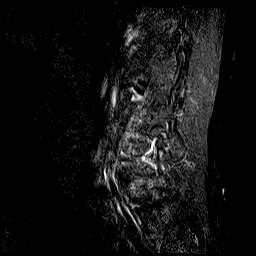
[im 5/13]
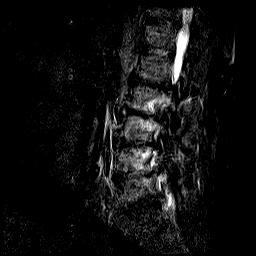
[im 9/13]
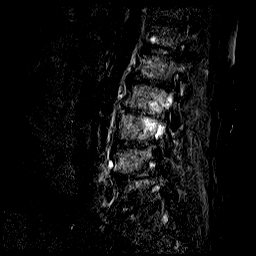
[im 13/13]
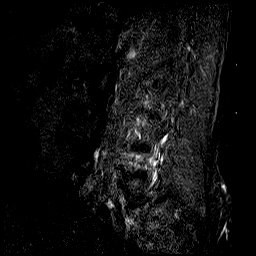

[Series 8: T2 · coronal · 4.0mm · 1.46mm/px · 6 of 20 slices shown (2 of 3)]
[im 1/20]
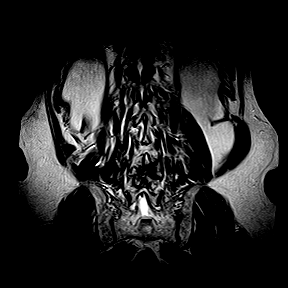
[im 4/20]
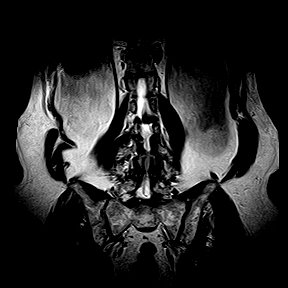
[im 8/20]
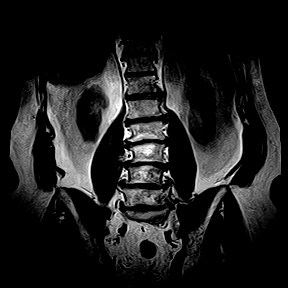
[im 12/20]
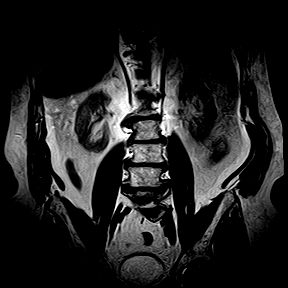
[im 16/20]
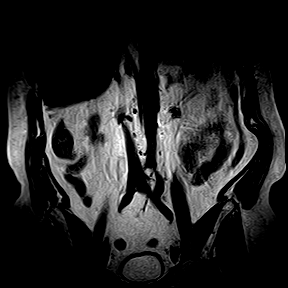
[im 20/20]
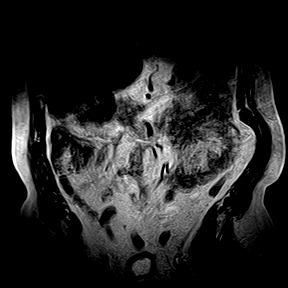

[Series 9: T2 · axial · 4.0mm · 0.47mm/px · z∈[-96,+138]mm · 8 of 26 slices shown (3 of 3)]
[im 1/26]
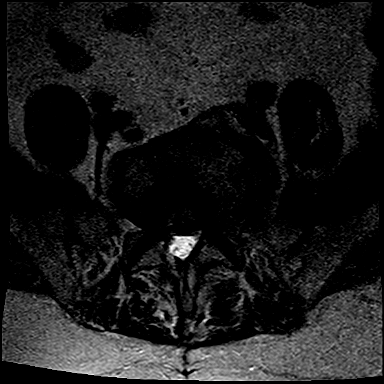
[im 4/26]
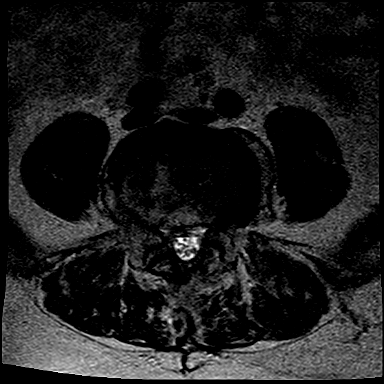
[im 8/26]
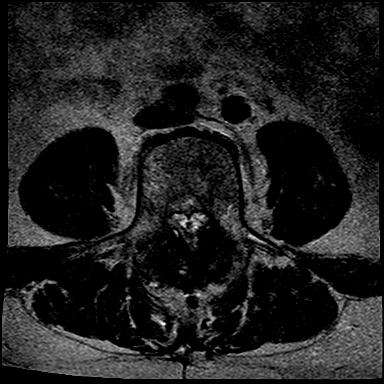
[im 11/26]
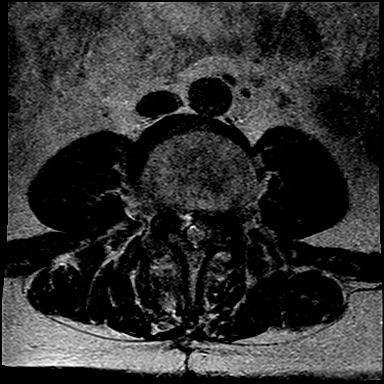
[im 15/26]
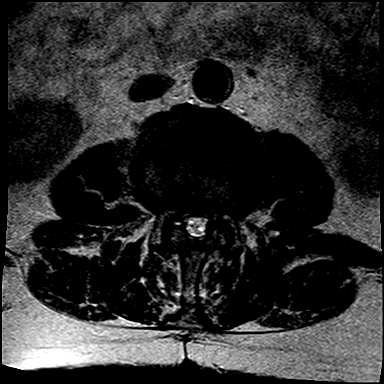
[im 18/26]
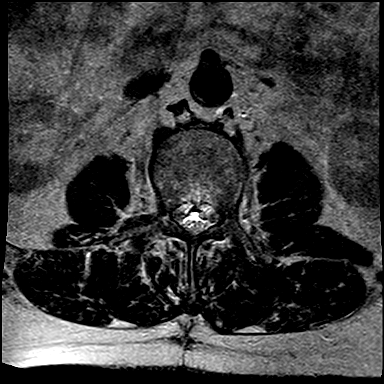
[im 22/26]
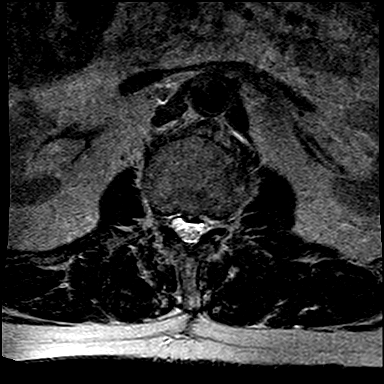
[im 26/26]
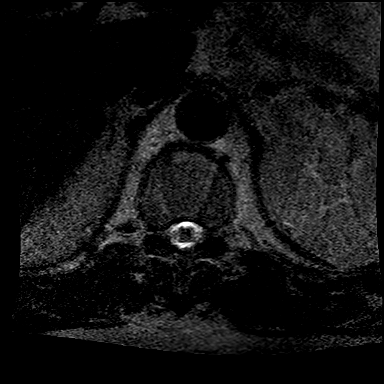

[Series 10: T1 fat-sat · axial · 4.0mm · 0.70mm/px · z∈[-96,+138]mm · 8 of 26 slices shown]
[im 1/26]
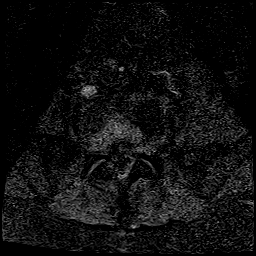
[im 4/26]
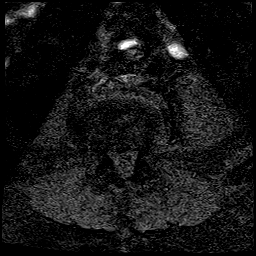
[im 8/26]
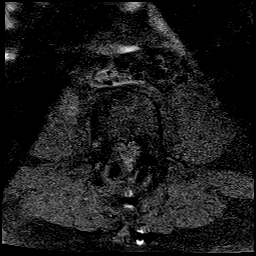
[im 11/26]
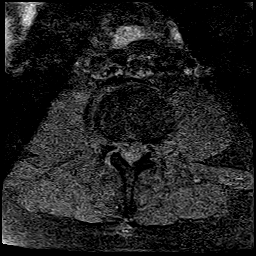
[im 15/26]
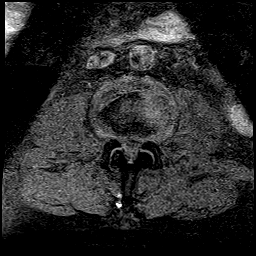
[im 18/26]
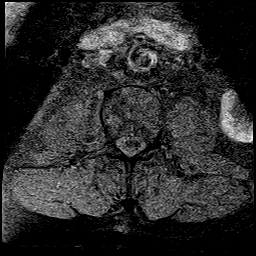
[im 22/26]
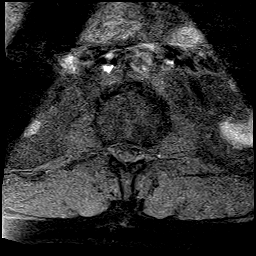
[im 26/26]
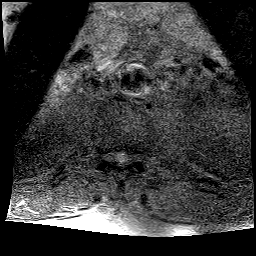

[Series 11: T1 fat-sat post-contrast · sagittal · 4.5mm · 1.05mm/px · 4 of 13 slices shown (1 of 2)]
[im 1/13]
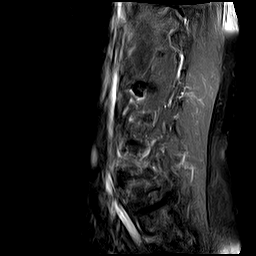
[im 5/13]
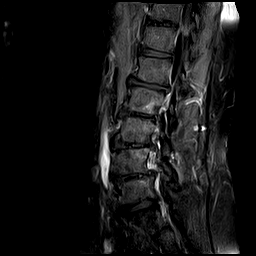
[im 9/13]
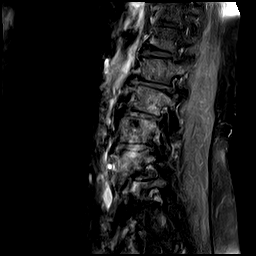
[im 13/13]
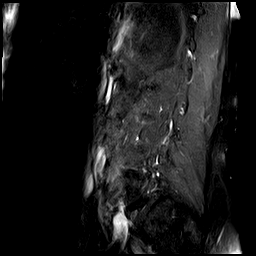

[Series 12: T1 fat-sat post-contrast · axial · 4.0mm · 0.70mm/px · z∈[-96,+138]mm · 8 of 26 slices shown (2 of 2)]
[im 1/26]
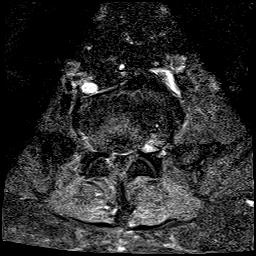
[im 4/26]
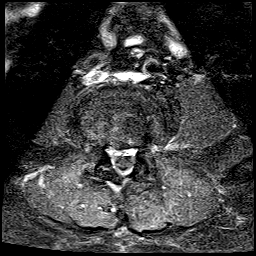
[im 8/26]
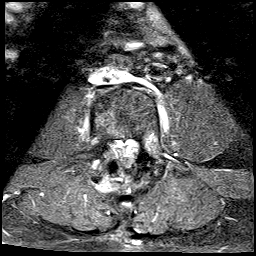
[im 11/26]
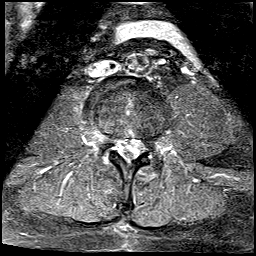
[im 15/26]
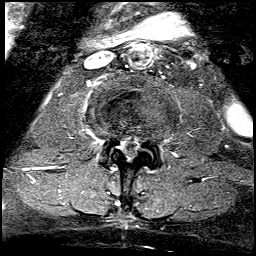
[im 18/26]
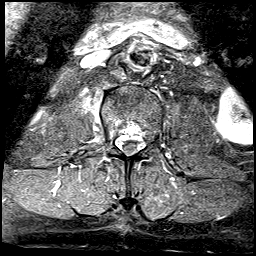
[im 22/26]
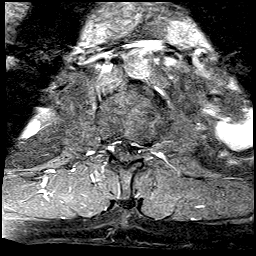
[im 26/26]
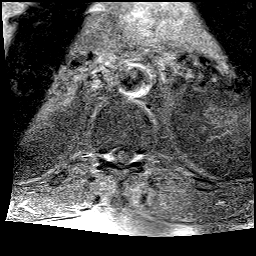

[48 of 48 positions shown; findings below may reference images not displayed]

FINDINGS: No acute bony lesions of lumbar vertebrae are seen.

At L1-2 level, significant degenerative disc disease and bilateral facet arthropathy are noted causing moderate compromise of thecal sac and significant compromise of both lateral recesses and neural foramina similar to previous examination.

At L2-3 level, severe bilateral facet arthropathy and significant degenerative disc disease with bulging annulus are noted.  Postsurgical changes are noted with enhancing epidural scar tissue.  Significant compromise of both neural foramina due to bulging annulus and facet arthropathy are noted. Significant compromise of thecal sac is noted.

At L3-4 level, advanced degenerative disc disease and severe bilateral facet arthropathy are noted causing severe degree of spinal stenosis and severe compromise of both neural foramina.  Postsurgical changes and enhancing epidural scar tissue are noted.

At L4-5 level, first-degree spondylolisthesis is noted as well as postsurgical changes with enhancing epidural scar tissue.  Bulging annulus and facet arthropathy are causing significant biforaminal stenosis.

At L5-S1 level, severe degenerative disc changes with facet arthropathy are causing significant biforaminal stenosis.

Paravertebral soft tissues are unremarkable.
IMPRESSION: 1. No acute focal bone changes are seen in the lumbar spine. 

2. Severe multilevel degenerative changes and postsurgical changes at multiple levels as described above in detail.

3. Paravertebral soft tissues are unremarkable.

## 2022-12-24 NOTE — Telephone Encounter (Signed)
MRI shows progression of his spinal disease, with worst at L5-S1.   That's almost definitely what's causing his issues.   If he is willing, I'm going to go ahead with the referral back to  Dr Harl Bowie at [facility].   I'm not 100% sure what else they can offer since he had surgery not too long ago, but at least there is a possibility.

## 2022-12-25 ENCOUNTER — Other Ambulatory Visit (HOSPITAL_COMMUNITY): Payer: Self-pay | Admitting: HEMATOLOGY-ONCOLOGY

## 2022-12-31 ENCOUNTER — Other Ambulatory Visit (RURAL_HEALTH_CENTER): Payer: Self-pay | Admitting: INTERNAL MEDICINE

## 2022-12-31 ENCOUNTER — Telehealth (RURAL_HEALTH_CENTER): Payer: Self-pay | Admitting: INTERNAL MEDICINE

## 2022-12-31 DIAGNOSIS — M5416 Radiculopathy, lumbar region: Secondary | ICD-10-CM

## 2022-12-31 NOTE — Telephone Encounter (Signed)
Patient was informed of the MRI results. Patient would like a referral to [facility] at [facility].

## 2023-01-31 ENCOUNTER — Ambulatory Visit (RURAL_HEALTH_CENTER): Payer: Medicare Other | Attending: INTERNAL MEDICINE | Admitting: INTERNAL MEDICINE

## 2023-01-31 ENCOUNTER — Ambulatory Visit: Payer: Medicare Other | Attending: INTERNAL MEDICINE | Admitting: INTERNAL MEDICINE

## 2023-01-31 ENCOUNTER — Encounter (RURAL_HEALTH_CENTER): Payer: Self-pay | Admitting: INTERNAL MEDICINE

## 2023-01-31 ENCOUNTER — Other Ambulatory Visit: Payer: Self-pay

## 2023-01-31 VITALS — BP 134/77 | HR 78 | Resp 18 | Ht 71.0 in | Wt 215.5 lb

## 2023-01-31 DIAGNOSIS — M5431 Sciatica, right side: Secondary | ICD-10-CM | POA: Insufficient documentation

## 2023-01-31 DIAGNOSIS — I10 Essential (primary) hypertension: Secondary | ICD-10-CM

## 2023-01-31 DIAGNOSIS — I129 Hypertensive chronic kidney disease with stage 1 through stage 4 chronic kidney disease, or unspecified chronic kidney disease: Secondary | ICD-10-CM | POA: Insufficient documentation

## 2023-01-31 DIAGNOSIS — E1122 Type 2 diabetes mellitus with diabetic chronic kidney disease: Secondary | ICD-10-CM | POA: Insufficient documentation

## 2023-01-31 DIAGNOSIS — R110 Nausea: Secondary | ICD-10-CM | POA: Insufficient documentation

## 2023-01-31 DIAGNOSIS — M5441 Lumbago with sciatica, right side: Secondary | ICD-10-CM

## 2023-01-31 DIAGNOSIS — E039 Hypothyroidism, unspecified: Secondary | ICD-10-CM | POA: Insufficient documentation

## 2023-01-31 DIAGNOSIS — M5432 Sciatica, left side: Secondary | ICD-10-CM

## 2023-01-31 DIAGNOSIS — M1A3790 Chronic gout due to renal impairment, unspecified ankle and foot, without tophus (tophi): Secondary | ICD-10-CM | POA: Insufficient documentation

## 2023-01-31 DIAGNOSIS — C9001 Multiple myeloma in remission: Secondary | ICD-10-CM | POA: Insufficient documentation

## 2023-01-31 DIAGNOSIS — K219 Gastro-esophageal reflux disease without esophagitis: Secondary | ICD-10-CM | POA: Insufficient documentation

## 2023-01-31 DIAGNOSIS — Z9481 Bone marrow transplant status: Secondary | ICD-10-CM | POA: Insufficient documentation

## 2023-01-31 DIAGNOSIS — E119 Type 2 diabetes mellitus without complications: Secondary | ICD-10-CM | POA: Insufficient documentation

## 2023-01-31 DIAGNOSIS — E782 Mixed hyperlipidemia: Secondary | ICD-10-CM | POA: Insufficient documentation

## 2023-01-31 DIAGNOSIS — Z9484 Stem cells transplant status: Secondary | ICD-10-CM | POA: Insufficient documentation

## 2023-01-31 DIAGNOSIS — T458X5A Adverse effect of other primarily systemic and hematological agents, initial encounter: Secondary | ICD-10-CM

## 2023-01-31 DIAGNOSIS — J4520 Mild intermittent asthma, uncomplicated: Secondary | ICD-10-CM | POA: Insufficient documentation

## 2023-01-31 DIAGNOSIS — R600 Localized edema: Secondary | ICD-10-CM

## 2023-01-31 DIAGNOSIS — G8929 Other chronic pain: Secondary | ICD-10-CM | POA: Insufficient documentation

## 2023-01-31 DIAGNOSIS — M159 Polyosteoarthritis, unspecified: Secondary | ICD-10-CM

## 2023-01-31 DIAGNOSIS — M87180 Osteonecrosis due to drugs, jaw: Secondary | ICD-10-CM | POA: Insufficient documentation

## 2023-01-31 DIAGNOSIS — Z7984 Long term (current) use of oral hypoglycemic drugs: Secondary | ICD-10-CM | POA: Insufficient documentation

## 2023-01-31 DIAGNOSIS — I701 Atherosclerosis of renal artery: Secondary | ICD-10-CM | POA: Insufficient documentation

## 2023-01-31 DIAGNOSIS — N1831 Chronic kidney disease, stage 3a: Secondary | ICD-10-CM | POA: Insufficient documentation

## 2023-01-31 DIAGNOSIS — M5442 Lumbago with sciatica, left side: Secondary | ICD-10-CM | POA: Insufficient documentation

## 2023-01-31 LAB — CBC WITH DIFF
BASOPHIL #: 0 10*3/uL (ref 0.00–0.10)
BASOPHIL %: 1 % (ref 0–1)
EOSINOPHIL #: 0 10*3/uL (ref 0.00–0.50)
EOSINOPHIL %: 1 %
HCT: 39.5 % (ref 36.7–47.1)
HGB: 13.5 g/dL (ref 12.5–16.3)
LYMPHOCYTE #: 2.1 10*3/uL (ref 1.00–3.00)
LYMPHOCYTE %: 26 % (ref 16–44)
MCH: 34.9 pg — ABNORMAL HIGH (ref 23.8–33.4)
MCHC: 34.2 g/dL (ref 32.5–36.3)
MCV: 102 fL — ABNORMAL HIGH (ref 73.0–96.2)
MONOCYTE #: 0.7 10*3/uL (ref 0.30–1.00)
MONOCYTE %: 9 % (ref 5–13)
MPV: 8.4 fL (ref 7.4–11.4)
NEUTROPHIL #: 5.1 10*3/uL (ref 1.85–7.80)
NEUTROPHIL %: 64 % (ref 43–77)
PLATELETS: 197 10*3/uL (ref 140–440)
RBC: 3.87 10*6/uL — ABNORMAL LOW (ref 4.06–5.63)
RDW: 14.1 % (ref 12.1–16.2)
WBC: 8 10*3/uL (ref 3.6–10.2)

## 2023-01-31 LAB — COMPREHENSIVE METABOLIC PNL, FASTING
ALBUMIN/GLOBULIN RATIO: 1.4 (ref 0.8–1.4)
ALBUMIN: 3.9 g/dL (ref 3.5–5.7)
ALKALINE PHOSPHATASE: 80 U/L (ref 34–104)
ALT (SGPT): 21 U/L (ref 7–52)
ANION GAP: 4 mmol/L (ref 4–13)
AST (SGOT): 18 U/L (ref 13–39)
BILIRUBIN TOTAL: 0.4 mg/dL (ref 0.3–1.2)
BUN/CREA RATIO: 16 (ref 6–22)
BUN: 22 mg/dL (ref 7–25)
CALCIUM, CORRECTED: 9.3 mg/dL (ref 8.9–10.8)
CALCIUM: 9.2 mg/dL (ref 8.6–10.3)
CHLORIDE: 108 mmol/L — ABNORMAL HIGH (ref 98–107)
CO2 TOTAL: 23 mmol/L (ref 21–31)
CREATININE: 1.38 mg/dL — ABNORMAL HIGH (ref 0.60–1.30)
ESTIMATED GFR: 58 mL/min/{1.73_m2} — ABNORMAL LOW (ref >59)
GLOBULIN: 2.8 — ABNORMAL LOW (ref 2.9–5.4)
GLUCOSE: 105 mg/dL (ref 74–109)
OSMOLALITY, CALCULATED: 274 mOsm/kg (ref 270–290)
POTASSIUM: 4.7 mmol/L (ref 3.5–5.1)
PROTEIN TOTAL: 6.7 g/dL (ref 6.4–8.9)
SODIUM: 135 mmol/L — ABNORMAL LOW (ref 136–145)

## 2023-01-31 LAB — LIPID PANEL
CHOL/HDL RATIO: 5
CHOLESTEROL: 186 mg/dL (ref <200)
HDL CHOL: 37 mg/dL (ref 23–92)
LDL CALC: 126 mg/dL — ABNORMAL HIGH (ref 0–100)
TRIGLYCERIDES: 117 mg/dL (ref <=150)
VLDL CALC: 23 mg/dL (ref 0–50)

## 2023-01-31 LAB — HGA1C (HEMOGLOBIN A1C WITH EST AVG GLUCOSE): HEMOGLOBIN A1C: 6.8 % — ABNORMAL HIGH (ref 4.0–6.0)

## 2023-01-31 LAB — THYROID STIMULATING HORMONE (SENSITIVE TSH): TSH: 1.038 u[IU]/mL (ref 0.450–5.330)

## 2023-01-31 LAB — URIC ACID: URIC ACID: 3.9 mg/dL (ref 2.3–7.6)

## 2023-01-31 MED ORDER — LEVOTHYROXINE 100 MCG TABLET
100.0000 ug | ORAL_TABLET | Freq: Every day | ORAL | 3 refills | Status: DC
Start: 2023-01-31 — End: 2024-02-09

## 2023-01-31 NOTE — Nursing Note (Signed)
Patient is here for his follow up. Patient reports he is concerned over his weight loss.

## 2023-01-31 NOTE — Progress Notes (Signed)
[facility]  [address]  [place], [place]  [address]  Phone: [phone] Fax: [phone]    Name: Connor Patterson                       Date of Birth: 1961-11-16   MRN:  [id number]                         Date of visit: 01/31/2023     Chief Complaint: Follow Up 3 Months (Concerns over weight loss)    Current Outpatient Medications   Medication Sig    allopurinoL (ZYLOPRIM) 300 mg Oral Tablet Take 1 Tablet (300 mg total) by mouth Once a day for 180 days    ascorbic acid, vitamin C, (VITAMIN C) 500 mg Oral Tablet Take 1 Tablet (500 mg total) by mouth    aspirin (ECOTRIN) 325 mg Oral Tablet, Delayed Release (E.C.) Take 1 Tablet (325 mg total) by mouth    baclofen (LIORESAL) 10 mg Oral Tablet Take 1 Tablet (10 mg total) by mouth Three times a day for 30 days    BREO ELLIPTA 100-25 mcg/dose Inhalation Disk with Device Take 1 INHALATION  by inhalation Once a day    chlorhexidine gluconate (PERIDEX) 0.12 % Mucous Membrane Mouthwash 15 mL    cyanocobalamin (VITAMIN B 12) 1,000 mcg Oral Tablet Take 1 Tablet (1,000 mcg total) by mouth    famotidine (PEPCID) 20 mg Oral Tablet Take 1 Tablet (20 mg total) by mouth    furosemide (LASIX) 20 mg Oral Tablet Take 1 Tablet (20 mg total) by mouth    glipiZIDE (GLUCOTROL) 10 mg Oral Tablet Take 1 Tablet (10 mg total) by mouth Once a day for 180 days    ketoconazole (NIZORAL) 2 % Cream Apply 1 Application  topically Twice daily    levothyroxine (SYNTHROID) 100 mcg Oral Tablet Take 1 tablet by mouth once daily    magnesium oxide (MAG-OX) 400 mg Oral Tablet Take 1 Tablet (400 mg total) by mouth    mometasone (NASONEX) 50 mcg/actuation Nasal Spray, Non-Aerosol 1 Spray    mupirocin (BACTROBAN) 2 % Ointment Apply 1 Application  topically Three times a day    pentoxifylline (TRENTAL) 400 mg Oral Tablet Sustained Release Take 1 Tablet (400 mg total) by mouth    polyethylene glycol (MIRALAX) 17 gram/dose Oral Powder Take 3 teaspoons (17 g total) by mouth    potassium chloride  (K-TAB) 20 mEq Oral Tablet Sustained Release Take by mouth Once a day    SALIVA STIMULANT COMB. 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[place])  07/19/2022    Benign hypertension 04/08/2022    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/08/2022    Type 2 diabetes mellitus without complication, without long-term current use of insulin (CMS HCC) 04/08/2022    Multiple myeloma in remission (CMS  [place]) 04/08/2022    Mixed hyperlipidemia 04/08/2022    Acquired hypothyroidism 04/08/2022    TIA (transient ischemic attack) 04/08/2022    Gout 04/08/2022    Chronic nausea 04/08/2022    Osteoarthritis 04/08/2022    Chronic low back pain 04/08/2022    Bilateral sciatica 04/08/2022    Mild intermittent asthma without complication [id number]    Peripheral vascular disease (CMS [place]) 04/08/2022    Gastroesophageal reflux disease without esophagitis 04/08/2022    Stage 3a chronic kidney disease (CMS [place]) 04/08/2022    Localized edema 04/08/2022    Statin intolerance 04/08/2022       Subjective:   Patient is here for CDM visit.    Was seen in January for radicular pain after sitting for a prolonged period of time. MRI showed extensive multi level degenerative disease. Patient was referred to Dr Harl Bowie at [facility]. Apt scheduled 02-11-2023. He has been doing therapy with Maralyn Sago.     Concerned about his weight loss. It has continued despite discontinuation of Rybelsus. He lost 11# in 2 months, but his weight  is the same as it was just after Thanks giving.   He has dramatically cut carbs.     Hypertension  W/ Hx of renal artery stenosis  Following with nephrology/cardiology    Diabetes mellitus type 2  Currently on glipizide  Due for eye exam  Did not tolerate DPP4.  Not a candidate for metformin. Did not tolerate SGLT2 2/2 side pain.  Labs on 02-04-2019 showed hemoglobin A1c was 10.2.  Glipizide was added at that time  Labs on 05-24-2019 showed hemoglobin A1c 9.9  Patient resistant to starting insulin  09-2019 working on diet. has lost weight.  Labs on 09-06-2019 showed hemoglobin A1c 12.0, micro albumin creatnine ratio <30   * Suggested start Insulin, but patient only will accept oral agents  Labs on 12-13-2019 showed hemoglobin A1c 12.6, microalbumin creatinine ratio less than 30  03-2020 Samples of Jardiance given to patient. Tolerarated so increased to max oral dose.  06-2020 D/C Jardiance due to side pain  Labs on 06-08-2020 showed hemoglobin A1c 8.9   * Started Rybelsus  09-2020 Log 85-191  Labs on 12-15-2020 showed hemoglobin A1c 6.9  Labs on 03-16-2021 showed hemoglobin A1c 7.3  Labs on 09-10-2021 showed hemoglobin A1c 6.8  Labs on 01-07-2022 showed hemoglobin A1c 8.0  Labs on 06/26/2022 showed hemoglobin A1c 7.0, microalbumin creatinine ratio less than 30  Labs on 10/31/2022 showed hemoglobin A1c 6.5  Rybelsus stopped secondary to nausea    Multiple myeloma  Lambda light change, w/ renal disease  s/p autologous stem cell transplant, thalidomide and aredia chemotherapy  In Remission, was on Ninlaro maintenance  02-23-2020 No changes  05-17-2020 No changes.  06-14-2020 No changes  12-2020 Dr Herd has resumed care as they could not find anyone to cover. Patient will not need Ninlaro this year.  [id number] Saw Dr Herd. He is definitely retiring. Will be seeing Dr Jeanice Lim.  [id number] Saw Dr Roxanne Mins. No changes.  [id number] Saw Dr Roxanne Mins. No changes. Will see [facility] tomorrow.  01-2022 Bone Scan 1. Negative exam for fractures,  lytic or blastic lesions in the skull, spine, ribs, pelvis, humeri and femora.  2. 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  07-2022 no changes    Mixed hyperlipidemia  Does not tolerate statins except for Livalo.  Insurance will not cover Livalo  Have tried all available non-statin oral agents, and all of proven ineffective  Does not meet criteria for injectables  Labs on 02-04-2019 showed total cholesterol 244, triglycerides 280, HDL 30, direct LDL 132  Labs on 05-24-2019 showed total cholesterol 273, triglycerides 701, HDL 25, direct LDL 117  Labs on 09-06-2019 showed total cholesterol 203, triglycerides 324, HDL 33, direct LDL 17  Labs on 12-13-2019 showed total cholesterol 212, triglycerides 217, HDL 32, direct LDL 144  Labs on 06-08-2020 showed total cholesterol 227, triglycerides 179, HDL 34, direct LDL 160  08-2020 Dr Bosie Helper resume Zetia despite lack of efficacy in prior attempt  Labs on 12-15-2020 showed total cholesterol 179, triglycerides 125, HDL 38, direct LDL 114  Labs on 03-16-2021 showed total cholesterol 192, triglycerides 102, HDL 47, calculated LDL 125  Labs on 07-04-2021 showed total cholesterol 210, triglycerides 130, HDL 38, direct LDL 152  Labs on 01-07-2022 showed total cholesterol 230, triglycerides 206, HDL 37,   Labs on 06/26/2022 showed total cholesterol 228, triglycerides 271, HDL 34, direct LDL 170  Labs on 10/31/2022 showed total cholesterol 230, triglycerides 150, HDL 36, direct LDL 170    Hypothyroidism  On Synthroid 100 mcg  Labs on 02-04-2019 showed TSH 1.58 on Synthroid 100 mcg  Labs on 05-24-2019 showed TSH 3.83 on Synthroid 100 mcg  Labs on 09-06-2019 showed TSH 0.90 on Synthroid 100 mcg  Labs on 12-13-2019 showed TSH 1.28 on Synthroid 100 mcg  Labs on 06-08-2020 showed TSH 1.53 on Synthroid 100 mcg  Labs on 12-15-2020 showed TSH 3.46 on Synthroid 100 mcg  Labs on 03-16-2021 showed TSH 3.05 on Synthroid 100 mcg  Labs on 07-04-2021  showed TSH 3.05 on Synthroid 100 mcg  Labs on 01-07-2022 showed TSH 0.81 on Synthroid 100 mcg  Labs on 11/18/2022 showed TSH 0.97 on Synthroid 100 mcg    TIA  Occurred 06-2020  On aspirin and Zetia  Follow with Dr. Ezzard Flax    Gout  No flares since last visit  Labs on 09-06-2019 showed uric acid 5.8  Labs on 06-08-2020 showed uric acid 5.6  Labs on 12-15-2020 showed uric acid 5.8  Labs on 03-16-2021 showed uric acid 5.0  Labs on 07-04-2021 showed uric acid 4.7  Labs on 10/31/2022 showed uric acid 5.7    Chronic nausea  On Zofran    Osteoarthritis/Low Back Pain/Sciatica  Known Disc Degeneration, had a spinal block about 6 years ago. Dr Gardiner Coins.  Previously on hydrocodone from oncology for pain associated with multiple myeloma.  Has since been weaned off  Currently on Flexeril  Resumed course of physical therapy  09-2019 on codeine from Dr Enriqueta Shutter  12-2019 Going to Dr Harl Bowie in [facility] for consultation on 27th. He had an initial interview with NP last month. Suspects will have surgery  01-27-2020 Had surgery with Dr Harl Bowie. Doing well. Is having physical therapy with Dr Romero Liner.  [id number] Knee pain has improved. Still having popping and cracking.    Asthma  Following with pulmonology    Edema  On lasix    Peripheral vascular disease  Doing well with pentoxifylline    GERD  Doing well with Pepcid    Chronic kidney disease stage II-III  Labs on 09-06-2019 showed BUN 37, creatinine 1.5, GFR 51  Labs on 12-13-2019 showed BUN 23, creatinine 1.3, GFR greater than 60  Labs on 06-10-2020 showed BUN 20, creatinine 1.4, GFR 55  Labs on 12-15-2020 showed BUN 28, creatinine 1.3, GFR greater than 60  Labs on 03-16-2021 showed BUN 28, creatinine 1.3, GFR greater than 60  Labs  on 09-10-2021 showed BUN 31, creatinine 1.5, GFR 51  Labs on 01-07-2022 showed BUN 24, creatinine 1.2, GFR greater than 60  Labs on 06/26/2022 showed BUN 32, creatinine 1.47, GFR 54  Labs on 10/31/2022 showed BUN 27, creatinine 1.26, GFR 65    ROS:  10  systems reviewed and were negative except as noted.   + edema  + nausea  + abnormal gait, back pain, joint pain, muscle cramps, muscle weakness, numbness, tingling    Objective :  BP 134/77 (Site: Right, Patient Position: Sitting, Cuff Size: Adult)   Pulse 78   Resp 18   Ht 1.803 m ('5\' 11"'$ )   Wt 97.8 kg (215 lb 8 oz)   SpO2 98%   BMI 30.06 kg/m       General:  appears chronically ill and moderately obese  Lungs:  Clear to auscultation bilaterally.   Cardiovascular:  regular rate and rhythm  Neurologic:  Grossly normal  Psychiatric:  Normal    Data reviewed:  Heme/Onc labs    Assessment/Plan:  Assessment/Plan   1. Benign hypertension    2. Mixed hyperlipidemia    3. Type 2 diabetes mellitus without complication, without long-term current use of insulin (CMS HCC)    4. Acquired hypothyroidism    5. Renal artery stenosis (CMS HCC)    6. Mild intermittent asthma without complication    7. Gastroesophageal reflux disease without esophagitis    8. 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)    9. Chronic bilateral low back pain with bilateral sciatica    10. Bilateral sciatica    11. Chronic gout due to renal impairment involving foot without tophus, unspecified laterality    12. Localized edema    13. Primary osteoarthritis involving multiple joints    14. Chronic nausea    15. Stage 3a chronic kidney disease (CMS HCC)    16. Stem cells transplant status (CMS [place])    17. Status post autologous bone marrow transplantation (CMS HCC)    18. 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)         Diabetes  Continue glipizide and Rybelsus  Repeat labs. May be able to adjust labs.   He does not like diabetic shoes. Actually caused more foot pain.     Hypertension  Continue diuretics  Follow with nephrology/cardiology    Multiple myeloma  In remission  Follow with oncology    Mixed hyperlipidemia  Monitor    Hypothyroidism  Continue Synthroid    Nausea  Continue Zofran    Arthritis/back pain/sciatica  Continue  Flexeril  Follow with Neurosurgery    Gout  Continue allopurinol    Asthma  Continue follow-up with pulmonology    Chronic edema  Continue diuretics    GERD  Continue Pepcid    Chronic disease stage III  Monitor    TIA  Follow with neurology    Restless leg  Monitor    Weight Loss  Probably calorie deficit - which is intentional so patient will remain off diabetic meds  Not helped by chronic nausea    Casimer Lanius, DO, [facility]  Internal Medicine

## 2023-02-01 LAB — LDL CHOLESTEROL, DIRECT: LDL DIRECT: 143 mg/dL — ABNORMAL HIGH (ref <100)

## 2023-02-03 ENCOUNTER — Telehealth (RURAL_HEALTH_CENTER): Payer: Self-pay | Admitting: INTERNAL MEDICINE

## 2023-02-03 NOTE — Telephone Encounter (Signed)
Left message for pt to call back  °

## 2023-02-03 NOTE — Telephone Encounter (Signed)
-----   Message from [facility], DO sent at 02/03/2023  8:04 AM EST -----  A1c was 6.8, which is technically up from 6.5, but still better than most of his readings. Even more impressive considering he got rid of a strong medication. Good job and no changes.  Cholesterol much better. 30 points.  Other labs look ok

## 2023-02-03 NOTE — Telephone Encounter (Signed)
Pt called back and was notified of results

## 2023-03-12 ENCOUNTER — Other Ambulatory Visit (RURAL_HEALTH_CENTER): Payer: Self-pay | Admitting: INTERNAL MEDICINE

## 2023-03-12 ENCOUNTER — Telehealth (RURAL_HEALTH_CENTER): Payer: Self-pay | Admitting: INTERNAL MEDICINE

## 2023-03-12 MED ORDER — BACLOFEN 10 MG TABLET
10.0000 mg | ORAL_TABLET | Freq: Three times a day (TID) | ORAL | 5 refills | Status: DC
Start: 2023-03-12 — End: 2023-08-06

## 2023-03-12 NOTE — Telephone Encounter (Signed)
Patient called for a refill on his baclofen. He stated that his old prescription has expired and he needs to get a refill sent to [facility].

## 2023-05-01 ENCOUNTER — Ambulatory Visit: Payer: Medicare Other | Attending: INTERNAL MEDICINE

## 2023-05-01 ENCOUNTER — Other Ambulatory Visit: Payer: Self-pay

## 2023-05-01 DIAGNOSIS — I5032 Chronic diastolic (congestive) heart failure: Secondary | ICD-10-CM | POA: Insufficient documentation

## 2023-05-01 LAB — BASIC METABOLIC PANEL
ANION GAP: 7 mmol/L (ref 4–13)
BUN/CREA RATIO: 21 (ref 6–22)
BUN: 33 mg/dL — ABNORMAL HIGH (ref 7–25)
CALCIUM: 9.4 mg/dL (ref 8.6–10.3)
CHLORIDE: 105 mmol/L (ref 98–107)
CO2 TOTAL: 25 mmol/L (ref 21–31)
CREATININE: 1.59 mg/dL — ABNORMAL HIGH (ref 0.60–1.30)
ESTIMATED GFR: 49 mL/min/{1.73_m2} — ABNORMAL LOW (ref >59)
GLUCOSE: 163 mg/dL — ABNORMAL HIGH (ref 74–109)
OSMOLALITY, CALCULATED: 285 mOsm/kg (ref 270–290)
POTASSIUM: 4.9 mmol/L (ref 3.5–5.1)
SODIUM: 137 mmol/L (ref 136–145)

## 2023-05-05 ENCOUNTER — Ambulatory Visit: Payer: Medicare Other | Attending: INTERNAL MEDICINE | Admitting: INTERNAL MEDICINE

## 2023-05-05 ENCOUNTER — Encounter (RURAL_HEALTH_CENTER): Payer: Self-pay | Admitting: INTERNAL MEDICINE

## 2023-05-05 ENCOUNTER — Other Ambulatory Visit: Payer: Self-pay

## 2023-05-05 ENCOUNTER — Ambulatory Visit (RURAL_HEALTH_CENTER): Payer: Medicare Other | Attending: INTERNAL MEDICINE | Admitting: INTERNAL MEDICINE

## 2023-05-05 VITALS — BP 136/78 | HR 76 | Resp 18 | Ht 71.0 in | Wt 216.5 lb

## 2023-05-05 DIAGNOSIS — M1A3790 Chronic gout due to renal impairment, unspecified ankle and foot, without tophus (tophi): Secondary | ICD-10-CM | POA: Insufficient documentation

## 2023-05-05 DIAGNOSIS — I10 Essential (primary) hypertension: Secondary | ICD-10-CM | POA: Insufficient documentation

## 2023-05-05 DIAGNOSIS — J4520 Mild intermittent asthma, uncomplicated: Secondary | ICD-10-CM | POA: Insufficient documentation

## 2023-05-05 DIAGNOSIS — C9001 Multiple myeloma in remission: Secondary | ICD-10-CM | POA: Insufficient documentation

## 2023-05-05 DIAGNOSIS — E119 Type 2 diabetes mellitus without complications: Secondary | ICD-10-CM

## 2023-05-05 DIAGNOSIS — E1122 Type 2 diabetes mellitus with diabetic chronic kidney disease: Secondary | ICD-10-CM | POA: Insufficient documentation

## 2023-05-05 DIAGNOSIS — E039 Hypothyroidism, unspecified: Secondary | ICD-10-CM | POA: Insufficient documentation

## 2023-05-05 DIAGNOSIS — I129 Hypertensive chronic kidney disease with stage 1 through stage 4 chronic kidney disease, or unspecified chronic kidney disease: Secondary | ICD-10-CM | POA: Insufficient documentation

## 2023-05-05 DIAGNOSIS — E782 Mixed hyperlipidemia: Secondary | ICD-10-CM | POA: Insufficient documentation

## 2023-05-05 DIAGNOSIS — R110 Nausea: Secondary | ICD-10-CM | POA: Insufficient documentation

## 2023-05-05 DIAGNOSIS — R600 Localized edema: Secondary | ICD-10-CM | POA: Insufficient documentation

## 2023-05-05 DIAGNOSIS — I701 Atherosclerosis of renal artery: Secondary | ICD-10-CM | POA: Insufficient documentation

## 2023-05-05 DIAGNOSIS — M5441 Lumbago with sciatica, right side: Secondary | ICD-10-CM | POA: Insufficient documentation

## 2023-05-05 DIAGNOSIS — N1831 Chronic kidney disease, stage 3a (CMS HCC): Secondary | ICD-10-CM | POA: Insufficient documentation

## 2023-05-05 DIAGNOSIS — M5442 Lumbago with sciatica, left side: Secondary | ICD-10-CM | POA: Insufficient documentation

## 2023-05-05 DIAGNOSIS — I739 Peripheral vascular disease, unspecified: Secondary | ICD-10-CM | POA: Insufficient documentation

## 2023-05-05 DIAGNOSIS — G8929 Other chronic pain: Secondary | ICD-10-CM | POA: Insufficient documentation

## 2023-05-05 DIAGNOSIS — K219 Gastro-esophageal reflux disease without esophagitis: Secondary | ICD-10-CM | POA: Insufficient documentation

## 2023-05-05 DIAGNOSIS — M159 Polyosteoarthritis, unspecified: Secondary | ICD-10-CM | POA: Insufficient documentation

## 2023-05-05 DIAGNOSIS — Z7984 Long term (current) use of oral hypoglycemic drugs: Secondary | ICD-10-CM | POA: Insufficient documentation

## 2023-05-05 LAB — THYROID STIMULATING HORMONE (SENSITIVE TSH): TSH: 1.702 u[IU]/mL (ref 0.450–5.330)

## 2023-05-05 LAB — COMPREHENSIVE METABOLIC PNL, FASTING
ALBUMIN/GLOBULIN RATIO: 1.4 (ref 0.8–1.4)
ALBUMIN: 4 g/dL (ref 3.5–5.7)
ALKALINE PHOSPHATASE: 103 U/L (ref 34–104)
ALT (SGPT): 22 U/L (ref 7–52)
ANION GAP: 4 mmol/L (ref 4–13)
AST (SGOT): 19 U/L (ref 13–39)
BILIRUBIN TOTAL: 0.3 mg/dL (ref 0.3–1.2)
BUN/CREA RATIO: 22 (ref 6–22)
BUN: 31 mg/dL — ABNORMAL HIGH (ref 7–25)
CALCIUM, CORRECTED: 9.6 mg/dL (ref 8.9–10.8)
CALCIUM: 9.6 mg/dL (ref 8.6–10.3)
CHLORIDE: 106 mmol/L (ref 98–107)
CO2 TOTAL: 23 mmol/L (ref 21–31)
CREATININE: 1.41 mg/dL — ABNORMAL HIGH (ref 0.60–1.30)
ESTIMATED GFR: 57 mL/min/{1.73_m2} — ABNORMAL LOW (ref >59)
GLOBULIN: 2.9 (ref 2.9–5.4)
GLUCOSE: 122 mg/dL — ABNORMAL HIGH (ref 74–109)
OSMOLALITY, CALCULATED: 274 mOsm/kg (ref 270–290)
POTASSIUM: 5.3 mmol/L — ABNORMAL HIGH (ref 3.5–5.1)
PROTEIN TOTAL: 6.9 g/dL (ref 6.4–8.9)
SODIUM: 133 mmol/L — ABNORMAL LOW (ref 136–145)

## 2023-05-05 LAB — CBC WITH DIFF
BASOPHIL #: 0 10*3/uL (ref 0.00–0.10)
BASOPHIL %: 0 % (ref 0–1)
EOSINOPHIL #: 0.1 10*3/uL (ref 0.00–0.50)
EOSINOPHIL %: 1 %
HCT: 40.6 % (ref 36.7–47.1)
HGB: 13.7 g/dL (ref 12.5–16.3)
LYMPHOCYTE #: 1.6 10*3/uL (ref 1.00–3.00)
LYMPHOCYTE %: 20 % (ref 16–44)
MCH: 34.6 pg — ABNORMAL HIGH (ref 23.8–33.4)
MCHC: 33.8 g/dL (ref 32.5–36.3)
MCV: 102.3 fL — ABNORMAL HIGH (ref 73.0–96.2)
MONOCYTE #: 0.8 10*3/uL (ref 0.30–1.00)
MONOCYTE %: 10 % (ref 5–13)
MPV: 8.9 fL (ref 7.4–11.4)
NEUTROPHIL #: 5.4 10*3/uL (ref 1.85–7.80)
NEUTROPHIL %: 69 % (ref 43–77)
PLATELETS: 198 10*3/uL (ref 140–440)
RBC: 3.97 10*6/uL — ABNORMAL LOW (ref 4.06–5.63)
RDW: 14.5 % (ref 12.1–16.2)
WBC: 7.8 10*3/uL (ref 3.6–10.2)

## 2023-05-05 LAB — LIPID PANEL
CHOL/HDL RATIO: 5
CHOLESTEROL: 223 mg/dL — ABNORMAL HIGH (ref <200)
HDL CHOL: 45 mg/dL (ref >=40)
LDL CALC: 164 mg/dL — ABNORMAL HIGH (ref 0–100)
TRIGLYCERIDES: 68 mg/dL (ref <=150)
VLDL CALC: 14 mg/dL (ref 0–50)

## 2023-05-05 LAB — HGA1C (HEMOGLOBIN A1C WITH EST AVG GLUCOSE): HEMOGLOBIN A1C: 6.8 % — ABNORMAL HIGH (ref 4.0–6.0)

## 2023-05-05 LAB — URIC ACID: URIC ACID: 4.3 mg/dL (ref 2.3–7.6)

## 2023-05-05 MED ORDER — TRIAMCINOLONE ACETONIDE 0.1 % TOPICAL OINTMENT
TOPICAL_OINTMENT | CUTANEOUS | 0 refills | Status: AC
Start: 2023-05-05 — End: ?

## 2023-05-05 NOTE — Progress Notes (Signed)
[facility]  [address]  [place], [place]  [address]  Phone: [phone] Fax: [phone]    Name: Connor Patterson                       Date of Birth: 11/29/1961   MRN:  [id number]                         Date of visit: 05/05/2023     Chief Complaint: Follow Up 3 Months (No new problems.)    Current Outpatient Medications   Medication Sig    allopurinoL (ZYLOPRIM) 300 mg Oral Tablet Take 1 Tablet (300 mg total) by mouth Once a day for 180 days    ascorbic acid, vitamin C, (VITAMIN C) 500 mg Oral Tablet Take 1 Tablet (500 mg total) by mouth    aspirin (ECOTRIN) 325 mg Oral Tablet, Delayed Release (E.C.) Take 1 Tablet (325 mg total) by mouth    baclofen (LIORESAL) 10 mg Oral Tablet Take 1 Tablet (10 mg total) by mouth Three times a day    BREO ELLIPTA 100-25 mcg/dose Inhalation Disk with Device Take 1 INHALATION  by inhalation Once a day    chlorhexidine gluconate (PERIDEX) 0.12 % Mucous Membrane Mouthwash 15 mL    cyanocobalamin (VITAMIN B 12) 1,000 mcg Oral Tablet Take 1 Tablet (1,000 mcg total) by mouth    famotidine (PEPCID) 20 mg Oral Tablet Take 1 Tablet (20 mg total) by mouth    furosemide (LASIX) 20 mg Oral Tablet Take 1 Tablet (20 mg total) by mouth    glipiZIDE (GLUCOTROL) 10 mg Oral Tablet Take 1 Tablet (10 mg total) by mouth Once a day for 180 days    ketoconazole (NIZORAL) 2 % Cream Apply 1 Application  topically Twice daily    levothyroxine (SYNTHROID) 100 mcg Oral Tablet Take 1 Tablet (100 mcg total) by mouth Once a day    magnesium oxide (MAG-OX) 400 mg Oral Tablet Take 1 Tablet (400 mg total) by mouth    mometasone (NASONEX) 50 mcg/actuation Nasal Spray, Non-Aerosol 1 Spray    mupirocin (BACTROBAN) 2 % Ointment Apply 1 Application  topically Three times a day    pentoxifylline (TRENTAL) 400 mg Oral Tablet Sustained Release Take 1 Tablet (400 mg total) by mouth    polyethylene glycol (MIRALAX) 17 gram/dose Oral Powder Take 3 teaspoons (17 g total) by mouth    potassium chloride (K-TAB) 20  mEq Oral Tablet Sustained Release Take by mouth Once a day    SALIVA STIMULANT COMB. 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    Benign hypertension 04/08/2022    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/08/2022    Type 2 diabetes mellitus without complication, without long-term current use of insulin (CMS HCC) 04/08/2022    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)  04/08/2022    Mixed hyperlipidemia 04/08/2022    Acquired hypothyroidism 04/08/2022    TIA (transient ischemic attack) 04/08/2022    Gout 04/08/2022    Chronic nausea 04/08/2022    Osteoarthritis 04/08/2022    Chronic low back pain 04/08/2022    Bilateral sciatica 04/08/2022    Mild intermittent asthma without complication 04/08/2022    Peripheral vascular disease (CMS HCC) 04/08/2022    Gastroesophageal reflux disease without esophagitis 04/08/2022    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    Dr Renda Rolls did some labs. We do not have copies.  Planning on cardiac monitor. Is worried about his BP. (All our readings are ok). Reports had single irregular beat. Was told not to take potassium pills.     Hypertension  W/ Hx of renal artery stenosis  Following with nephrology/cardiology    Diabetes mellitus type 2  Currently on glipizide  Eye exam Dr Brooke Dare in March 2024. Everything was good  per patient.   Did not tolerate DPP4.  Not a candidate for metformin. Did not tolerate SGLT2 2/2 side pain. GLPs caused nausea.   Labs on 02-04-2019 showed hemoglobin A1c was 10.2.  Glipizide was added at that time  Labs on 05-24-2019 showed hemoglobin A1c 9.9  Patient resistant to starting insulin  09-2019 working on diet. has lost weight.  Labs on 09-06-2019 showed hemoglobin A1c 12.0, micro albumin creatnine ratio <30   * Suggested start Insulin, but patient only will accept oral agents  Labs on 12-13-2019 showed hemoglobin A1c 12.6, microalbumin creatinine ratio less than 30  03-2020 Samples of Jardiance given to patient. Tolerarated so increased to max oral dose.  06-2020 D/C Jardiance due to side pain  Labs on 06-08-2020 showed hemoglobin A1c 8.9   * Started Rybelsus  09-2020 Log 85-191  Labs on 12-15-2020 showed hemoglobin A1c 6.9  Labs on 03-16-2021 showed hemoglobin A1c 7.3  Labs on 09-10-2021 showed hemoglobin A1c 6.8  Labs on 01-07-2022 showed hemoglobin A1c 8.0  Labs on 06/26/2022 showed hemoglobin A1c 7.0, microalbumin creatinine ratio less than 30  Labs on 10/31/2022 showed hemoglobin A1c 6.5  Rybelsus stopped secondary to nausea  Labs on 01/31/2023 showed hemoglobin A1c 6.8    Multiple myeloma  Lambda light change, w/ renal disease  s/p autologous stem cell transplant, thalidomide and aredia chemotherapy  In Remission, was on Ninlaro maintenance  02-23-2020 No changes  05-17-2020 No changes.  06-14-2020 No changes  12-2020 Dr Herd has resumed care as they could not find anyone to cover. Patient will not need Ninlaro this year.  16-1096 Saw Dr Herd. He is definitely retiring. Will be seeing Dr Lovina Reach.  03-5408 Saw Dr Vena Austria. No changes.  81-1914 Saw Dr Vena Austria. No changes. Will see [facility] tomorrow.  01-2022 Bone Scan 1. Negative exam for fractures, lytic or blastic lesions in the skull, spine, ribs, pelvis, humeri and femora.  2. 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  [id number] no changes    Mixed hyperlipidemia  Does not tolerate statins except for Livalo.  Insurance will not cover Livalo  Have tried all available non-statin oral agents, and all of proven ineffective  Does not meet criteria for injectables  Labs on 02-04-2019 showed total cholesterol 244, triglycerides 280, HDL 30, direct LDL 132  Labs on 05-24-2019 showed  total cholesterol 273, triglycerides 701, HDL 25, direct LDL 117  Labs on 09-06-2019 showed total cholesterol 203, triglycerides 324, HDL 33, direct LDL 17  Labs on 12-13-2019 showed total cholesterol 212, triglycerides 217, HDL 32, direct LDL 144  Labs on 06-08-2020 showed total cholesterol 227, triglycerides 179, HDL 34, direct LDL 160  08-2020 Dr Aretha Parrot resume Zetia despite lack of efficacy in prior attempt  Labs on 12-15-2020 showed total cholesterol 179, triglycerides 125, HDL 38, direct LDL 114  Labs on 03-16-2021 showed total cholesterol 192, triglycerides 102, HDL 47, calculated LDL 125  Labs on 07-04-2021 showed total cholesterol 210, triglycerides 130, HDL 38, direct LDL 152  Labs on 01-07-2022 showed total cholesterol 230, triglycerides 206, HDL 37,   Labs on 06/26/2022 showed total cholesterol 228, triglycerides 271, HDL 34, direct LDL 170  Labs on 10/31/2022 showed total cholesterol 230, triglycerides 150, HDL 36, direct LDL 170  Labs on 01/31/2023 showed total cholesterol 186, triglycerides 117, HDL 37, direct LDL 143    Hypothyroidism  On Synthroid 100 mcg  Labs on 02-04-2019 showed TSH 1.58 on Synthroid 100 mcg  Labs on 05-24-2019 showed TSH 3.83 on Synthroid 100 mcg  Labs on 09-06-2019 showed TSH 0.90 on Synthroid 100 mcg  Labs on 12-13-2019 showed TSH 1.28 on Synthroid 100 mcg  Labs on 06-08-2020 showed TSH 1.53 on Synthroid 100 mcg  Labs on 12-15-2020 showed TSH 3.46 on Synthroid 100 mcg  Labs on 03-16-2021 showed TSH 3.05 on Synthroid 100 mcg  Labs on 07-04-2021 showed TSH 3.05 on Synthroid 100 mcg  Labs on 01-07-2022 showed TSH 0.81  on Synthroid 100 mcg  Labs on 11/18/2022 showed TSH 0.97 on Synthroid 100 mcg  Labs on 01/31/2023 showed TSH 1.04 on Synthroid 100 mcg    TIA  Occurred 06-2020  On aspirin and Zetia  Follow with Dr. Ramond Dial    Gout  No flares since last visit  Labs on 09-06-2019 showed uric acid 5.8  Labs on 06-08-2020 showed uric acid 5.6  Labs on 12-15-2020 showed uric acid 5.8  Labs on 03-16-2021 showed uric acid 5.0  Labs on 07-04-2021 showed uric acid 4.7  Labs on 10/31/2022 showed uric acid 5.7  Labs on 01/31/2023 showed uric acid 3.9    Chronic nausea  On Zofran    Osteoarthritis/Low Back Pain/Sciatica  Known Disc Degeneration, had a spinal block about 6 years ago. Dr Virl Axe.  Previously on hydrocodone from oncology for pain associated with multiple myeloma.  Has since been weaned off  Currently on Flexeril  Resumed course of physical therapy  09-2019 on codeine from Dr Tobin Chad  12-2019 Going to Dr Wyline Mood in [facility] for consultation on 27th. He had an initial interview with NP last month. Suspects will have surgery  01-27-2020 Had surgery with Dr Wyline Mood. Doing well. Is having physical therapy with Dr Jules Husbands.  16-1096 Knee pain has improved. Still having popping and cracking.  03-5408 Saw Neurosurgery. They are planning on referring for injections  04-2023 had injection  05-2023 Plans on repeating in a few months.     Asthma  Following with pulmonology    Edema  On lasix    Peripheral vascular disease  Doing well with pentoxifylline    GERD  Doing well with Pepcid    Chronic kidney disease stage II-III  Labs on 09-06-2019 showed BUN 37, creatinine 1.5, GFR 51  Labs on 12-13-2019 showed BUN 23, creatinine 1.3, GFR greater than 60  Labs on 06-10-2020 showed BUN  20, creatinine 1.4, GFR 55  Labs on 12-15-2020 showed BUN 28, creatinine 1.3, GFR greater than 60  Labs on 03-16-2021 showed BUN 28, creatinine 1.3, GFR greater than 60  Labs on 09-10-2021 showed BUN 31, creatinine 1.5, GFR 51  Labs on 01-07-2022 showed BUN 24,  creatinine 1.2, GFR greater than 60  Labs on 06/26/2022 showed BUN 32, creatinine 1.47, GFR 54  Labs on 10/31/2022 showed BUN 27, creatinine 1.26, GFR 65  Labs on 01/31/2023 showed BUN 22, creatinine 1.38, GFR 58    ROS:  10 systems reviewed and were negative except as noted.   + edema  + nausea  + abnormal gait, back pain, joint pain, muscle cramps, muscle weakness, numbness, tingling    Objective :  BP 136/78 (Site: Right, Patient Position: Sitting, Cuff Size: Adult)   Pulse 76   Resp 18   Ht 1.803 m (5\' 11" )   Wt 98.2 kg (216 lb 8 oz)   SpO2 98%   BMI 30.20 kg/m       General:  appears chronically ill and moderately obese  Lungs:  Clear to auscultation bilaterally.   Cardiovascular:  regular rate and rhythm  Neurologic:  Grossly normal  Psychiatric:  Normal    Data reviewed:  Heme/Onc visit - no change. F/U in September  Heme/Onc labs  Neurosurgery note    Assessment/Plan:  Assessment/Plan   1. Benign hypertension    2. Mixed hyperlipidemia    3. Type 2 diabetes mellitus without complication, without long-term current use of insulin (CMS HCC)    4. Peripheral vascular disease (CMS HCC)    5. Renal artery stenosis (CMS HCC)    6. Mild intermittent asthma without complication    7. 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 Gastroesophageal reflux disease without esophagitis    9. Chronic nausea    10. Acquired hypothyroidism    11. Chronic bilateral low back pain with bilateral sciatica    12. Primary osteoarthritis involving multiple joints    13. 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 Chronic gout due to renal impairment involving foot without tophus, unspecified laterality    15. Localized edema          Diabetes  Continue glipizide and Rybelsus  Repeat labs. May be able to adjust labs.   He does not like diabetic shoes. Actually caused more foot pain.     Hypertension  Continue diuretics  Follow with nephrology/cardiology    Multiple myeloma  In remission  Follow with oncology    Mixed  hyperlipidemia  Monitor    Hypothyroidism  Continue Synthroid    Nausea  Continue Zofran    Arthritis/back pain/sciatica  Continue Flexeril  Follow with Neurosurgery    Gout  Continue allopurinol    Asthma  Continue follow-up with pulmonology    Chronic edema  Continue diuretics    GERD  Continue Pepcid    Chronic disease stage III  Monitor    TIA  Follow with neurology    Restless leg  Monitor    Terald Sleeper, DO, [facility]  Internal Medicine

## 2023-05-05 NOTE — Nursing Note (Signed)
Patient is here for his follow up. Patient reports no new problems.

## 2023-05-06 ENCOUNTER — Telehealth (RURAL_HEALTH_CENTER): Payer: Self-pay | Admitting: INTERNAL MEDICINE

## 2023-05-06 LAB — LDL CHOLESTEROL, DIRECT: LDL DIRECT: 186 mg/dL — ABNORMAL HIGH (ref <100)

## 2023-05-06 NOTE — Telephone Encounter (Signed)
Patient was notified of his lab results

## 2023-05-06 NOTE — Telephone Encounter (Signed)
-----   Message from [facility], DO sent at 05/06/2023  8:07 AM EDT -----  A1c is the same at 6.8, which isn't bad considering how little medication he is on. Hasn't changed since last time  Cholesterol much worse. About 40 points  Potassium is still up a bit, so try to reduce high potassium foods. Dr Renda Rolls getting rid of the pill was a very good idea.   Kidney function is slightly better

## 2023-05-22 ENCOUNTER — Other Ambulatory Visit (INDEPENDENT_AMBULATORY_CARE_PROVIDER_SITE_OTHER): Payer: Self-pay | Admitting: HEMATOLOGY-ONCOLOGY

## 2023-05-22 ENCOUNTER — Ambulatory Visit (INDEPENDENT_AMBULATORY_CARE_PROVIDER_SITE_OTHER): Payer: Self-pay | Admitting: HEMATOLOGY-ONCOLOGY

## 2023-05-22 DIAGNOSIS — C9000 Multiple myeloma not having achieved remission: Secondary | ICD-10-CM

## 2023-05-22 NOTE — Telephone Encounter (Signed)
-----   Message from Bonnita Hollow sent at 05/22/2023  9:24 AM EDT -----  Damita Lack Pt  Pt is calling to see if he needs to have his labs done by 6.24.24. Please call pt to let him know. Thank you!

## 2023-05-22 NOTE — Telephone Encounter (Signed)
Called pt's wife back to discuss, she saw orders were about to expire on his labs, and his clinic appt is not until 07/07/23.  Discussed that new labs can be ordered, and please allow 2 weeks for results, they can have drawn mid July. She states he will go to [facility] to have drawn, just ask for labs under Dr. Damita Lack.  Reminded of appt date/time and location of new clinic.

## 2023-05-26 ENCOUNTER — Other Ambulatory Visit: Payer: Medicare Other | Attending: HEMATOLOGY-ONCOLOGY

## 2023-05-26 ENCOUNTER — Ambulatory Visit (INDEPENDENT_AMBULATORY_CARE_PROVIDER_SITE_OTHER): Payer: Self-pay | Admitting: HEMATOLOGY-ONCOLOGY

## 2023-05-26 ENCOUNTER — Other Ambulatory Visit: Payer: Self-pay

## 2023-05-26 DIAGNOSIS — C9000 Multiple myeloma not having achieved remission: Secondary | ICD-10-CM | POA: Insufficient documentation

## 2023-05-26 LAB — COMPREHENSIVE METABOLIC PANEL, NON-FASTING
ALBUMIN/GLOBULIN RATIO: 1.5 — ABNORMAL HIGH (ref 0.8–1.4)
ALBUMIN: 3.9 g/dL (ref 3.5–5.7)
ALKALINE PHOSPHATASE: 91 U/L (ref 34–104)
ALT (SGPT): 16 U/L (ref 7–52)
ANION GAP: 6 mmol/L (ref 4–13)
AST (SGOT): 16 U/L (ref 13–39)
BILIRUBIN TOTAL: 0.4 mg/dL (ref 0.3–1.2)
BUN/CREA RATIO: 17 (ref 6–22)
BUN: 24 mg/dL (ref 7–25)
CALCIUM, CORRECTED: 9.1 mg/dL (ref 8.9–10.8)
CALCIUM: 9 mg/dL (ref 8.6–10.3)
CHLORIDE: 108 mmol/L — ABNORMAL HIGH (ref 98–107)
CO2 TOTAL: 22 mmol/L (ref 21–31)
CREATININE: 1.43 mg/dL — ABNORMAL HIGH (ref 0.60–1.30)
ESTIMATED GFR: 56 mL/min/{1.73_m2} — ABNORMAL LOW (ref >59)
GLOBULIN: 2.6 — ABNORMAL LOW (ref 2.9–5.4)
GLUCOSE: 131 mg/dL — ABNORMAL HIGH (ref 74–109)
OSMOLALITY, CALCULATED: 278 mOsm/kg (ref 270–290)
POTASSIUM: 4.8 mmol/L (ref 3.5–5.1)
PROTEIN TOTAL: 6.5 g/dL (ref 6.4–8.9)
SODIUM: 136 mmol/L (ref 136–145)

## 2023-05-26 LAB — CBC WITH DIFF
BASOPHIL #: 0.1 10*3/uL (ref 0.00–0.10)
BASOPHIL %: 1 % (ref 0–1)
EOSINOPHIL #: 0.1 10*3/uL (ref 0.00–0.50)
EOSINOPHIL %: 1 %
HCT: 37.7 % (ref 36.7–47.1)
HGB: 12.7 g/dL (ref 12.5–16.3)
LYMPHOCYTE #: 1.7 10*3/uL (ref 1.00–3.00)
LYMPHOCYTE %: 20 % (ref 16–44)
MCH: 34.7 pg — ABNORMAL HIGH (ref 23.8–33.4)
MCHC: 33.7 g/dL (ref 32.5–36.3)
MCV: 103.2 fL — ABNORMAL HIGH (ref 73.0–96.2)
MONOCYTE #: 0.9 10*3/uL (ref 0.30–1.00)
MONOCYTE %: 10 % (ref 5–13)
MPV: 8.6 fL (ref 7.4–11.4)
NEUTROPHIL #: 6 10*3/uL (ref 1.85–7.80)
NEUTROPHIL %: 68 % (ref 43–77)
PLATELETS: 184 10*3/uL (ref 140–440)
RBC: 3.66 10*6/uL — ABNORMAL LOW (ref 4.06–5.63)
RDW: 14.5 % (ref 12.1–16.2)
WBC: 8.7 10*3/uL (ref 3.6–10.2)

## 2023-05-28 LAB — PROTEIN FOR ELECTROPHORESIS: PROTEIN TOTAL: 5.7 g/dL (ref 5.6–7.6)

## 2023-05-28 LAB — ALBUMIN FOR ELECTROPHORESIS: ALBUMIN: 3.5 g/dL (ref 3.4–4.8)

## 2023-05-28 LAB — KAPPA AND LAMBDA FREE LIGHT CHAINS, SERUM
KAPPA FREE LIGHT CHAINS: 2.41 mg/dL (ref 1.25–3.25)
KAPPA/LAMBDA FLC RATIO: 0.96 (ref 0.80–2.10)
LAMBDA FREE LIGHT CHAINS: 2.52 mg/dL (ref 0.60–2.70)

## 2023-05-29 LAB — MONOCLONAL GAMMOPATHY PROFILE WITH IMMUNOTYPING REFLEX
ALBUMIN: 3.5 g/dL (ref 3.4–4.8)
KAPPA FREE LIGHT CHAINS: 2.41 mg/dL (ref 1.25–3.25)
KAPPA/LAMBDA FLC RATIO: 0.96 (ref 0.80–2.10)
LAMBDA FREE LIGHT CHAINS: 2.52 mg/dL (ref 0.60–2.70)
TOTAL PROTEIN: 5.7 g/dL

## 2023-06-03 ENCOUNTER — Other Ambulatory Visit (RURAL_HEALTH_CENTER): Payer: Self-pay | Admitting: INTERNAL MEDICINE

## 2023-07-02 ENCOUNTER — Telehealth (RURAL_HEALTH_CENTER): Payer: Self-pay | Admitting: INTERNAL MEDICINE

## 2023-07-02 ENCOUNTER — Other Ambulatory Visit (RURAL_HEALTH_CENTER): Payer: Self-pay | Admitting: INTERNAL MEDICINE

## 2023-07-02 MED ORDER — BREO ELLIPTA 100 MCG-25 MCG/DOSE POWDER FOR INHALATION
1.0000 | DISK | Freq: Every day | RESPIRATORY_TRACT | 3 refills | Status: DC
Start: 2023-07-02 — End: 2024-09-13

## 2023-07-02 NOTE — Telephone Encounter (Signed)
Patient called saying that Dr. Felton Clinton left and he needs a refill on his Breo Ellipta inhaler 100-25 mcg 1 puff daily sent to [facility] in [place]

## 2023-07-07 ENCOUNTER — Other Ambulatory Visit: Payer: Self-pay

## 2023-07-07 ENCOUNTER — Inpatient Hospital Stay (INDEPENDENT_AMBULATORY_CARE_PROVIDER_SITE_OTHER)
Admission: RE | Admit: 2023-07-07 | Discharge: 2023-07-07 | Disposition: A | Discharge status: Home / Self Care | Payer: Medicare Other | Source: Ambulatory Visit | Attending: NURSE PRACTITIONER | Admitting: NURSE PRACTITIONER

## 2023-07-07 ENCOUNTER — Ambulatory Visit: Payer: Medicare Other | Attending: NURSE PRACTITIONER | Admitting: NURSE PRACTITIONER

## 2023-07-07 ENCOUNTER — Encounter (INDEPENDENT_AMBULATORY_CARE_PROVIDER_SITE_OTHER): Payer: Self-pay | Admitting: NURSE PRACTITIONER

## 2023-07-07 VITALS — BP 158/80 | HR 76 | Temp 97.0°F | Ht 71.0 in | Wt 228.2 lb

## 2023-07-07 DIAGNOSIS — C9001 Multiple myeloma in remission: Secondary | ICD-10-CM | POA: Insufficient documentation

## 2023-07-07 DIAGNOSIS — Z9484 Stem cells transplant status: Secondary | ICD-10-CM | POA: Insufficient documentation

## 2023-07-07 DIAGNOSIS — E669 Obesity, unspecified: Secondary | ICD-10-CM | POA: Insufficient documentation

## 2023-07-07 DIAGNOSIS — C9000 Multiple myeloma not having achieved remission: Secondary | ICD-10-CM

## 2023-07-07 DIAGNOSIS — Z6831 Body mass index (BMI) 31.0-31.9, adult: Secondary | ICD-10-CM | POA: Insufficient documentation

## 2023-07-07 LAB — CBC WITH DIFF
BASOPHIL #: 0 10*3/uL (ref 0.00–0.10)
BASOPHIL %: 1 % (ref 0–1)
EOSINOPHIL #: 0.1 10*3/uL (ref 0.00–0.50)
EOSINOPHIL %: 2 % (ref 1–8)
HCT: 38.2 % (ref 36.7–47.1)
HGB: 12.9 g/dL (ref 12.5–16.3)
LYMPHOCYTE #: 2.4 10*3/uL (ref 1.00–3.00)
LYMPHOCYTE %: 31 % (ref 16–44)
MCH: 34.8 pg — ABNORMAL HIGH (ref 23.8–33.4)
MCHC: 33.8 g/dL (ref 32.5–36.3)
MCV: 103.2 fL — ABNORMAL HIGH (ref 73.0–96.2)
MONOCYTE #: 0.7 10*3/uL (ref 0.30–1.00)
MONOCYTE %: 10 % (ref 5–13)
MPV: 8.7 fL (ref 7.4–11.4)
NEUTROPHIL #: 4.4 10*3/uL (ref 1.85–7.80)
NEUTROPHIL %: 57 % (ref 43–77)
PLATELETS: 177 10*3/uL (ref 140–440)
RBC: 3.7 10*6/uL — ABNORMAL LOW (ref 4.06–5.63)
RDW: 14.8 % (ref 12.1–16.2)
WBC: 7.7 10*3/uL (ref 3.6–10.2)

## 2023-07-07 LAB — COMPREHENSIVE METABOLIC PANEL, NON-FASTING
ALBUMIN/GLOBULIN RATIO: 1.4 (ref 0.8–1.4)
ALBUMIN: 3.9 g/dL (ref 3.5–5.7)
ALKALINE PHOSPHATASE: 93 U/L (ref 34–104)
ALT (SGPT): 17 U/L (ref 7–52)
ANION GAP: 5 mmol/L (ref 4–13)
AST (SGOT): 16 U/L (ref 13–39)
BILIRUBIN TOTAL: 0.4 mg/dL (ref 0.3–1.0)
BUN/CREA RATIO: 22 (ref 6–22)
BUN: 28 mg/dL — ABNORMAL HIGH (ref 7–25)
CALCIUM, CORRECTED: 9.4 mg/dL (ref 8.9–10.8)
CALCIUM: 9.3 mg/dL (ref 8.6–10.3)
CHLORIDE: 109 mmol/L — ABNORMAL HIGH (ref 98–107)
CO2 TOTAL: 23 mmol/L (ref 21–31)
CREATININE: 1.27 mg/dL (ref 0.60–1.30)
ESTIMATED GFR: 64 mL/min/{1.73_m2} (ref >59)
GLOBULIN: 2.8 — ABNORMAL LOW (ref 2.9–5.4)
GLUCOSE: 139 mg/dL — ABNORMAL HIGH (ref 74–109)
OSMOLALITY, CALCULATED: 282 mOsm/kg (ref 270–290)
POTASSIUM: 4.7 mmol/L (ref 3.5–5.1)
PROTEIN TOTAL: 6.7 g/dL (ref 6.4–8.9)
SODIUM: 137 mmol/L (ref 136–145)

## 2023-07-07 NOTE — Cancer Center Note (Signed)
Department of Hematology/Oncology  Progress Note   Name: Connor Patterson  VFI:[id number]  Date of Birth: [birth year]  Encounter Date: 07/07/2023    REFERRING PROVIDER:  Terald Sleeper, DO  [address]  [place],  [place] [address]     REASON FOR OFFICE VISIT:  Multiple Myeloma     HISTORY OF PRESENT ILLNESS:  Connor Patterson is a 62 y.o. male who presents today for follow up of multiple myeloma.  He was originally diagnosed in January 2004, and at that time had renal failure and a respiratory infection his serum electrophoresis showed a monoclonal protein of 0.1 grams/deciliter.  His bone marrow biopsy at that time was consistent with the presence of multiple myeloma, and a skeletal survey showed numerous lytic lesions and he also had an elevated calcium level.     He was being seen at [facility] and had VAD chemotherapy for 4 cycles.  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  He was treated with thalidomide in a maintenance fashion.  He developed osteonecrosis of the jaw in 2013, and his bisphosphonate therapy was discontinued at that time.  He was apparently switch to Revlimid for maintenance treatment, and it was discontinued in 2017 due to poor tolerance.    Since 2018, the patient has been off of any maintenance therapy.  His monoclonal protein levels have been very low and he has been considered to still be in remission.  He is being followed with observation and continues to follow up at [facility].    07/07/2023: The patient is here for follow up of multiple myeloma.  He has been off of maintenance therapy and is having his labs rechecked every 6 months.  He states that he is doing well other than hurting his back in December 2023. He is following up with a spine specialist at [facility] for this. He complains of some continued fatigue and weight loss that he is unsure why he is having weight loss but is eating healthier.     ROS:   Pertinent review of systems as discussed in  HPI    HISTORY:  Past Medical History:   Diagnosis Date    Asthma     Diabetes mellitus, type 2 (CMS HCC)     Essential hypertension     Hx of transfusion        Past Surgical History:   Procedure Laterality Date    HX BACK SURGERY      3 times    HX COLONOSCOPY      LIMBAL STEM CELL TRANSPLANT      RENAL BIOPSY, OPEN         Social History     Socioeconomic History    Marital status: Married     Spouse name: Myriam Jacobson    Number of children: 2    Years of education: Not on file    Highest education level: Not on file   Occupational History    Not on file   Tobacco Use    Smoking status: Never    Smokeless tobacco: Former   Haematologist status: Never Used   Substance and Sexual Activity    Alcohol use: Never    Drug use: Never    Sexual activity: Not on file   Other Topics Concern    Not on file   Social History Narrative    Not on file     Social Determinants of Health  Financial Resource Strain: Not on file   Transportation Needs: No Transportation Needs (05/12/2023)    Received from [employer]     In the past 12 months, has lack of reliable transportation kept you from medical appointments, meetings, work or from getting things needed for daily living? : No   Social Connections: Not on file   Intimate Partner Violence: Not on file   Housing Stability: Low Risk  (05/12/2023)    Received from [facility]    Living Situation     What is your living situation today?: I have a steady place to live     Think about the place you live. Do you have problems with any of the following? Choose all that apply:: None/None on this list     Family Medical History:       Problem Relation (Age of Onset)    Autoimmune disease Mother    Carotid Stenosis Mother    Hodgkin's lymphoma Father            Current Outpatient Medications   Medication Sig    allopurinoL (ZYLOPRIM) 300 mg Oral Tablet Take 1 tablet by mouth once daily    ascorbic acid, vitamin C, (VITAMIN C) 500 mg Oral Tablet Take 1 Tablet (500 mg  total) by mouth    aspirin (ECOTRIN) 325 mg Oral Tablet, Delayed Release (E.C.) Take 1 Tablet (325 mg total) by mouth    baclofen (LIORESAL) 10 mg Oral Tablet Take 1 Tablet (10 mg total) by mouth Three times a day    BREO ELLIPTA 100-25 mcg/dose Inhalation Disk with Device Take 1 Inhalation by inhalation Once a day    chlorhexidine gluconate (PERIDEX) 0.12 % Mucous Membrane Mouthwash 15 mL    cyanocobalamin (VITAMIN B 12) 1,000 mcg Oral Tablet Take 1 Tablet (1,000 mcg total) by mouth    famotidine (PEPCID) 20 mg Oral Tablet Take 1 Tablet (20 mg total) by mouth    furosemide (LASIX) 20 mg Oral Tablet Take 1 Tablet (20 mg total) by mouth    glipiZIDE (GLUCOTROL) 10 mg Oral Tablet Take 1 Tablet (10 mg total) by mouth Once a day for 180 days    ketoconazole (NIZORAL) 2 % Cream Apply 1 Application  topically Twice daily    levothyroxine (SYNTHROID) 100 mcg Oral Tablet Take 1 Tablet (100 mcg total) by mouth Once a day    magnesium oxide (MAG-OX) 400 mg Oral Tablet Take 1 Tablet (400 mg total) by mouth    mometasone (NASONEX) 50 mcg/actuation Nasal Spray, Non-Aerosol 1 Spray    pentoxifylline (TRENTAL) 400 mg Oral Tablet Sustained Release Take 1 Tablet (400 mg total) by mouth    polyethylene glycol (MIRALAX) 17 gram/dose Oral Powder Take 3 teaspoons (17 g total) by mouth    pregabalin (LYRICA) 75 mg Oral Capsule Take 1 Capsule (75 mg total) by mouth    spironolactone (ALDACTONE) 100 mg Oral Tablet Take 1 Tablet (100 mg total) by mouth Twice daily    triamcinolone acetonide 0.1 % Ointment APPLY0.5 G OF OINTMENT TOPICALLY TO AFFECTED AREA TWICE DAILY    trimethoprim-sulfamethoxazole (BACTRIM DS) 160-800mg  per tablet Take 1 Tablet (160 mg total) by mouth Once a day    vitamin E 400 unit Oral Capsule Take 1 Capsule (400 Units total) by mouth     No Known Allergies    PHYSICAL EXAM:  BP (!) 158/80 (Site: Left Arm, Patient Position: Sitting, Cuff Size: Adult)   Pulse 76  Temp 36.1 C (97 F) (Temporal)   Ht 1.803 m (5\' 11" )    Wt 104 kg (228 lb 3.2 oz)   SpO2 97%   BMI 31.83 kg/m        ECOG Status: (0) Fully active, able to carry on all predisease performance without restriction   Physical Exam  Vitals reviewed.   Constitutional:       Appearance: He is obese.   Eyes:      Pupils: Pupils are equal, round, and reactive to light.   Cardiovascular:      Rate and Rhythm: Normal rate and regular rhythm.      Heart sounds: Normal heart sounds. No murmur heard.  Pulmonary:      Effort: Pulmonary effort is normal.   Musculoskeletal:         General: Normal range of motion.      Cervical back: Normal range of motion.   Lymphadenopathy:      Head:      Right side of head: No submental, submandibular, tonsillar, preauricular, posterior auricular or occipital adenopathy.      Left side of head: No submental, submandibular, tonsillar, preauricular, posterior auricular or occipital adenopathy.      Cervical: No cervical adenopathy.      Right cervical: No superficial, deep or posterior cervical adenopathy.     Left cervical: No superficial, deep or posterior cervical adenopathy.      Upper Body:      Right upper body: No supraclavicular or axillary adenopathy.      Left upper body: No supraclavicular or axillary adenopathy.      Lower Body: No right inguinal adenopathy. No left inguinal adenopathy.   Skin:     General: Skin is warm and dry.      Capillary Refill: Capillary refill takes less than 2 seconds.   Neurological:      General: No focal deficit present.      Mental Status: He is alert and oriented to person, place, and time. Mental status is at baseline.      Motor: Motor function is intact.      Coordination: Coordination is intact.      Gait: Gait is intact.   Psychiatric:         Attention and Perception: Attention normal.         Mood and Affect: Mood and affect normal.         Speech: Speech normal.         Behavior: Behavior normal.         Thought Content: Thought content normal.         Cognition and Memory: Cognition normal.          Judgment: Judgment normal.         LABS:   CBC  Diff   Lab Results   Component Value Date/Time    WBC 7.7 07/07/2023 11:25 AM    HGB 12.9 07/07/2023 11:25 AM    HCT 38.2 07/07/2023 11:25 AM    PLTCNT 177 07/07/2023 11:25 AM    RBC 3.70 (L) 07/07/2023 11:25 AM    MCV 103.2 (H) 07/07/2023 11:25 AM    MCHC 33.8 07/07/2023 11:25 AM    MCH 34.8 (H) 07/07/2023 11:25 AM    RDW 14.8 07/07/2023 11:25 AM    MPV 8.7 07/07/2023 11:25 AM    Lab Results   Component Value Date/Time    PMNS 57 07/07/2023 11:25 AM    LYMPHOCYTES 31 07/07/2023  11:25 AM    EOSINOPHIL 2 07/07/2023 11:25 AM    MONOCYTES 10 07/07/2023 11:25 AM    BASOPHILS 1 07/07/2023 11:25 AM    BASOPHILS 0.00 07/07/2023 11:25 AM    PMNABS 4.40 07/07/2023 11:25 AM    LYMPHSABS 2.40 07/07/2023 11:25 AM    EOSABS 0.10 07/07/2023 11:25 AM    MONOSABS 0.70 07/07/2023 11:25 AM            Comprehensive Metabolic Profile    Lab Results   Component Value Date    SODIUM 137 07/07/2023    POTASSIUM 4.7 07/07/2023    CHLORIDE 109 (H) 07/07/2023    CO2 23 07/07/2023    ANIONGAP 5 07/07/2023    BUN 28 (H) 07/07/2023    CREATININE 1.27 07/07/2023    ALBUMIN 3.9 07/07/2023    CALCIUM 9.3 07/07/2023    GLUCOSENF 139 (H) 07/07/2023    ALKPHOS 93 07/07/2023    ALT 17 07/07/2023    AST 16 07/07/2023    TOTBILIRUBIN 0.4 07/07/2023    TOTALPROTEIN 6.7 07/07/2023       ASSESSMENT:      ICD-10-CM    1. Multiple myeloma, remission status unspecified (CMS HCC)  C90.00 COMPREHENSIVE METABOLIC PANEL, NON-FASTING     MONOCLONAL GAMMOPATHY PROFILE WITH SPEP, FLC, AND IMMUNOTYPING REFLEX     CBC/DIFF             PLAN:   1. Multiple myeloma:  The patient is status post VAD induction, stem cell transplant, thalidomide maintenance followed by lenalidomide maintenance.  He has been on observation since 2018 and has not had any evidence of recurrence since that time.  We are basically following him as an [facility] patient, and we will recheck laboratory studies every 6 months.  He will have myeloma  specific lab studies completed today as ordered.  2. Osteonecrosis of the jaw:  No further bisphosphonate therapy is planned.    Naoma Diener was given the chance to ask questions, and these were answered to their satisfaction. The patient is welcome to call with any questions or concerns in the meantime.       On the day of the encounter, a total of 34 minutes was spent on this patient encounter including review of historical information, examination, documentation and post-visit activities.   Return in about 6 months (around 01/07/2024).   Benjaman Pott, APRN,FNP-BC ,07/07/2023 ,12:08     The patient's insurance company bears full legal and financial responsibility resulting from any deviations that they cause to my recommended treatment plan.   CC:  Mardee Postin Watrous, DO  [address]  [place] [address]    Baldomero Lamy Reedy, DO  [address]  [place],  [place] [address]    This note was partially generated using MModal Fluency Direct system, and there may be some incorrect words, spellings, and punctuation that were not noted in checking the note before saving.

## 2023-07-08 LAB — KAPPA AND LAMBDA FREE LIGHT CHAINS, SERUM
KAPPA FREE LIGHT CHAINS: 4.38 mg/dL — ABNORMAL HIGH (ref 1.25–3.25)
KAPPA/LAMBDA FLC RATIO: 1.75 (ref 0.80–2.10)
LAMBDA FREE LIGHT CHAINS: 2.51 mg/dL (ref 0.60–2.70)

## 2023-07-08 LAB — PROTEIN FOR ELECTROPHORESIS: PROTEIN TOTAL: 6.3 g/dL (ref 5.6–7.6)

## 2023-07-08 LAB — ALBUMIN FOR ELECTROPHORESIS: ALBUMIN: 3.7 g/dL (ref 3.4–4.8)

## 2023-07-10 LAB — MONOCLONAL GAMMOPATHY PROFILE WITH IMMUNOTYPING REFLEX
ALBUMIN: 3.7 g/dL (ref 3.4–4.8)
KAPPA FREE LIGHT CHAINS: 4.38 mg/dL — ABNORMAL HIGH (ref 1.25–3.25)
KAPPA/LAMBDA FLC RATIO: 1.75 (ref 0.80–2.10)
LAMBDA FREE LIGHT CHAINS: 2.51 mg/dL (ref 0.60–2.70)
TOTAL PROTEIN: 6.3 g/dL

## 2023-07-10 LAB — IMMUNOSUBTRACTION, SERUM: PATHOLOGIST INTERPRETATION (IMMUNOTYPING): NORMAL

## 2023-08-06 ENCOUNTER — Ambulatory Visit (RURAL_HEALTH_CENTER): Payer: Medicare Other | Attending: INTERNAL MEDICINE | Admitting: INTERNAL MEDICINE

## 2023-08-06 ENCOUNTER — Ambulatory Visit: Payer: Medicare Other | Attending: INTERNAL MEDICINE | Admitting: INTERNAL MEDICINE

## 2023-08-06 ENCOUNTER — Other Ambulatory Visit: Payer: Self-pay

## 2023-08-06 ENCOUNTER — Encounter (RURAL_HEALTH_CENTER): Payer: Self-pay | Admitting: INTERNAL MEDICINE

## 2023-08-06 VITALS — BP 130/72 | HR 76 | Resp 18 | Ht 71.0 in | Wt 228.0 lb

## 2023-08-06 DIAGNOSIS — E1122 Type 2 diabetes mellitus with diabetic chronic kidney disease: Secondary | ICD-10-CM | POA: Insufficient documentation

## 2023-08-06 DIAGNOSIS — E119 Type 2 diabetes mellitus without complications: Secondary | ICD-10-CM | POA: Insufficient documentation

## 2023-08-06 DIAGNOSIS — G8929 Other chronic pain: Secondary | ICD-10-CM | POA: Insufficient documentation

## 2023-08-06 DIAGNOSIS — M1A3790 Chronic gout due to renal impairment, unspecified ankle and foot, without tophus (tophi): Secondary | ICD-10-CM | POA: Insufficient documentation

## 2023-08-06 DIAGNOSIS — Z789 Other specified health status: Secondary | ICD-10-CM | POA: Insufficient documentation

## 2023-08-06 DIAGNOSIS — E782 Mixed hyperlipidemia: Secondary | ICD-10-CM | POA: Insufficient documentation

## 2023-08-06 DIAGNOSIS — J4520 Mild intermittent asthma, uncomplicated: Secondary | ICD-10-CM | POA: Insufficient documentation

## 2023-08-06 DIAGNOSIS — M5442 Lumbago with sciatica, left side: Secondary | ICD-10-CM | POA: Insufficient documentation

## 2023-08-06 DIAGNOSIS — E039 Hypothyroidism, unspecified: Secondary | ICD-10-CM | POA: Insufficient documentation

## 2023-08-06 DIAGNOSIS — M5432 Sciatica, left side: Secondary | ICD-10-CM | POA: Insufficient documentation

## 2023-08-06 DIAGNOSIS — M159 Polyosteoarthritis, unspecified: Secondary | ICD-10-CM | POA: Insufficient documentation

## 2023-08-06 DIAGNOSIS — I129 Hypertensive chronic kidney disease with stage 1 through stage 4 chronic kidney disease, or unspecified chronic kidney disease: Secondary | ICD-10-CM | POA: Insufficient documentation

## 2023-08-06 DIAGNOSIS — M5431 Sciatica, right side: Secondary | ICD-10-CM | POA: Insufficient documentation

## 2023-08-06 DIAGNOSIS — M87180 Osteonecrosis due to drugs, jaw: Secondary | ICD-10-CM | POA: Insufficient documentation

## 2023-08-06 DIAGNOSIS — Z7984 Long term (current) use of oral hypoglycemic drugs: Secondary | ICD-10-CM | POA: Insufficient documentation

## 2023-08-06 DIAGNOSIS — G459 Transient cerebral ischemic attack, unspecified: Secondary | ICD-10-CM | POA: Insufficient documentation

## 2023-08-06 DIAGNOSIS — N1831 Chronic kidney disease, stage 3a: Secondary | ICD-10-CM | POA: Insufficient documentation

## 2023-08-06 DIAGNOSIS — M5441 Lumbago with sciatica, right side: Secondary | ICD-10-CM | POA: Insufficient documentation

## 2023-08-06 DIAGNOSIS — I701 Atherosclerosis of renal artery: Secondary | ICD-10-CM | POA: Insufficient documentation

## 2023-08-06 DIAGNOSIS — K219 Gastro-esophageal reflux disease without esophagitis: Secondary | ICD-10-CM | POA: Insufficient documentation

## 2023-08-06 DIAGNOSIS — T458X5A Adverse effect of other primarily systemic and hematological agents, initial encounter: Secondary | ICD-10-CM | POA: Insufficient documentation

## 2023-08-06 DIAGNOSIS — R110 Nausea: Secondary | ICD-10-CM | POA: Insufficient documentation

## 2023-08-06 DIAGNOSIS — C9001 Multiple myeloma in remission: Secondary | ICD-10-CM | POA: Insufficient documentation

## 2023-08-06 DIAGNOSIS — I10 Essential (primary) hypertension: Secondary | ICD-10-CM | POA: Insufficient documentation

## 2023-08-06 DIAGNOSIS — I739 Peripheral vascular disease, unspecified: Secondary | ICD-10-CM | POA: Insufficient documentation

## 2023-08-06 LAB — CBC WITH DIFF
BASOPHIL #: 0 10*3/uL (ref 0.00–0.10)
BASOPHIL %: 1 % (ref 0–1)
EOSINOPHIL #: 0.1 10*3/uL (ref 0.00–0.50)
EOSINOPHIL %: 1 % (ref 1–8)
HCT: 39.5 % (ref 36.7–47.1)
HGB: 13.4 g/dL (ref 12.5–16.3)
LYMPHOCYTE #: 1.9 10*3/uL (ref 1.00–3.00)
LYMPHOCYTE %: 26 % (ref 16–44)
MCH: 34.8 pg — ABNORMAL HIGH (ref 23.8–33.4)
MCHC: 33.8 g/dL (ref 32.5–36.3)
MCV: 102.9 fL — ABNORMAL HIGH (ref 73.0–96.2)
MONOCYTE #: 0.6 10*3/uL (ref 0.30–1.00)
MONOCYTE %: 9 % (ref 5–13)
MPV: 9.2 fL (ref 7.4–11.4)
NEUTROPHIL #: 4.5 10*3/uL (ref 1.85–7.80)
NEUTROPHIL %: 64 % (ref 43–77)
PLATELETS: 190 10*3/uL (ref 140–440)
RBC: 3.84 10*6/uL — ABNORMAL LOW (ref 4.06–5.63)
RDW: 14.1 % (ref 12.1–16.2)
WBC: 7.1 10*3/uL (ref 3.6–10.2)

## 2023-08-06 LAB — COMPREHENSIVE METABOLIC PNL, FASTING
ALBUMIN/GLOBULIN RATIO: 1.5 — ABNORMAL HIGH (ref 0.8–1.4)
ALBUMIN: 4.2 g/dL (ref 3.5–5.7)
ALKALINE PHOSPHATASE: 100 U/L (ref 34–104)
ALT (SGPT): 20 U/L (ref 7–52)
ANION GAP: 7 mmol/L (ref 4–13)
AST (SGOT): 20 U/L (ref 13–39)
BILIRUBIN TOTAL: 0.3 mg/dL (ref 0.3–1.0)
BUN/CREA RATIO: 18 (ref 6–22)
BUN: 29 mg/dL — ABNORMAL HIGH (ref 7–25)
CALCIUM, CORRECTED: 9.4 mg/dL (ref 8.9–10.8)
CALCIUM: 9.6 mg/dL (ref 8.6–10.3)
CHLORIDE: 105 mmol/L (ref 98–107)
CO2 TOTAL: 24 mmol/L (ref 21–31)
CREATININE: 1.59 mg/dL — ABNORMAL HIGH (ref 0.60–1.30)
ESTIMATED GFR: 49 mL/min/{1.73_m2} — ABNORMAL LOW (ref >59)
GLOBULIN: 2.8 — ABNORMAL LOW (ref 2.9–5.4)
GLUCOSE: 108 mg/dL (ref 74–109)
OSMOLALITY, CALCULATED: 278 mOsm/kg (ref 270–290)
POTASSIUM: 5.1 mmol/L (ref 3.5–5.1)
PROTEIN TOTAL: 7 g/dL (ref 6.4–8.9)
SODIUM: 136 mmol/L (ref 136–145)

## 2023-08-06 LAB — HGA1C (HEMOGLOBIN A1C WITH EST AVG GLUCOSE): HEMOGLOBIN A1C: 6.5 % — ABNORMAL HIGH (ref 4.0–6.0)

## 2023-08-06 LAB — LIPID PANEL
CHOL/HDL RATIO: 4.8
CHOLESTEROL: 219 mg/dL — ABNORMAL HIGH (ref <200)
HDL CHOL: 46 mg/dL (ref >=40)
LDL CALC: 154 mg/dL — ABNORMAL HIGH (ref 0–100)
TRIGLYCERIDES: 94 mg/dL (ref <=150)
VLDL CALC: 19 mg/dL (ref 0–50)

## 2023-08-06 LAB — THYROID STIMULATING HORMONE (SENSITIVE TSH): TSH: 1.29 u[IU]/mL (ref 0.450–5.330)

## 2023-08-06 LAB — MICROALBUMIN/CREATININE RATIO, URINE, RANDOM
CREATININE RANDOM URINE: 27 mg/dL — ABNORMAL LOW (ref 30–125)
MICROALBUMIN RANDOM URINE: <0.7 mg/dL

## 2023-08-06 MED ORDER — BACLOFEN 10 MG TABLET
10.0000 mg | ORAL_TABLET | Freq: Three times a day (TID) | ORAL | 5 refills | Status: DC
Start: 2023-08-06 — End: 2023-12-15

## 2023-08-06 NOTE — Progress Notes (Signed)
[facility]  [address]  [place], [place]  [address]  Phone: [phone] Fax: [phone]    Name: Connor Patterson                       Date of Birth: 11-09-1961   MRN:  [id number]                         Date of visit: 08/06/2023     Chief Complaint: Follow Up 3 Months (No new problems.)    Current Outpatient Medications   Medication Sig    allopurinoL (ZYLOPRIM) 300 mg Oral Tablet Take 1 tablet by mouth once daily    ascorbic acid, vitamin C, (VITAMIN C) 500 mg Oral Tablet Take 1 Tablet (500 mg total) by mouth    aspirin (ECOTRIN) 325 mg Oral Tablet, Delayed Release (E.C.) Take 1 Tablet (325 mg total) by mouth    baclofen (LIORESAL) 10 mg Oral Tablet Take 1 Tablet (10 mg total) by mouth Three times a day    BREO ELLIPTA 100-25 mcg/dose Inhalation Disk with Device Take 1 Inhalation by inhalation Once a day    chlorhexidine gluconate (PERIDEX) 0.12 % Mucous Membrane Mouthwash 15 mL    cyanocobalamin (VITAMIN B 12) 1,000 mcg Oral Tablet Take 1 Tablet (1,000 mcg total) by mouth    famotidine (PEPCID) 20 mg Oral Tablet Take 1 Tablet (20 mg total) by mouth    furosemide (LASIX) 20 mg Oral Tablet Take 1 Tablet (20 mg total) by mouth    glipiZIDE (GLUCOTROL) 10 mg Oral Tablet Take 1 Tablet (10 mg total) by mouth Once a day for 180 days (Patient not taking: Reported on 08/06/2023)    ketoconazole (NIZORAL) 2 % Cream Apply 1 Application  topically Twice daily    levothyroxine (SYNTHROID) 100 mcg Oral Tablet Take 1 Tablet (100 mcg total) by mouth Once a day    magnesium oxide (MAG-OX) 400 mg Oral Tablet Take 1 Tablet (400 mg total) by mouth    mometasone (NASONEX) 50 mcg/actuation Nasal Spray, Non-Aerosol 1 Spray    pentoxifylline (TRENTAL) 400 mg Oral Tablet Sustained Release Take 1 Tablet (400 mg total) by mouth    polyethylene glycol (MIRALAX) 17 gram/dose Oral Powder Take 3 teaspoons (17 g total) by mouth    pregabalin (LYRICA) 75 mg Oral Capsule Take 1 Capsule (75 mg total) by mouth Three times a day     spironolactone (ALDACTONE) 100 mg Oral Tablet Take 1 Tablet (100 mg total) by mouth Twice daily    triamcinolone acetonide 0.1 % Ointment APPLY0.5 G OF OINTMENT TOPICALLY TO AFFECTED AREA TWICE DAILY    trimethoprim-sulfamethoxazole (BACTRIM DS) 160-800mg  per tablet Take 1 Tablet (160 mg total) by mouth Once a day    vitamin E 400 unit Oral Capsule Take 1 Capsule (400 Units total) by mouth       Patient Active Problem List    Diagnosis Date Noted    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    Benign hypertension 04/08/2022    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/08/2022    Type 2 diabetes mellitus without complication, without long-term current use of insulin (CMS HCC) 04/08/2022    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) 04/08/2022    Mixed hyperlipidemia 04/08/2022    Acquired hypothyroidism 04/08/2022  TIA (transient ischemic attack) 04/08/2022    Gout 04/08/2022    Chronic nausea 04/08/2022    Osteoarthritis 04/08/2022    Chronic low back pain 04/08/2022    Bilateral sciatica 04/08/2022    Mild intermittent asthma without complication 04/08/2022    Peripheral vascular disease (CMS HCC) 04/08/2022    Gastroesophageal reflux disease without esophagitis 04/08/2022    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    Hypertension  W/ Hx of renal artery stenosis  Following with nephrology/cardiology    Diabetes mellitus type 2  Currently on glipizide  Eye exam Dr Brooke Dare in March 2024. Everything was good per patient.   Did not tolerate DPP4.  Not a candidate for metformin. Did not tolerate SGLT2 2/2 side pain. GLPs caused nausea.   Labs on 02-04-2019 showed hemoglobin A1c was 10.2.  Glipizide was added at that time  Labs on 05-24-2019 showed  hemoglobin A1c 9.9  Patient resistant to starting insulin  09-2019 working on diet. has lost weight.  Labs on 09-06-2019 showed hemoglobin A1c 12.0, micro albumin creatnine ratio <30   * Suggested start Insulin, but patient only will accept oral agents  Labs on 12-13-2019 showed hemoglobin A1c 12.6, microalbumin creatinine ratio less than 30  03-2020 Samples of Jardiance given to patient. Tolerarated so increased to max oral dose.  06-2020 D/C Jardiance due to side pain  Labs on 06-08-2020 showed hemoglobin A1c 8.9   * Started Rybelsus  09-2020 Log 85-191  Labs on 12-15-2020 showed hemoglobin A1c 6.9  Labs on 03-16-2021 showed hemoglobin A1c 7.3  Labs on 09-10-2021 showed hemoglobin A1c 6.8  Labs on 01-07-2022 showed hemoglobin A1c 8.0  Labs on 06/26/2022 showed hemoglobin A1c 7.0, microalbumin creatinine ratio less than 30  Labs on 10/31/2022 showed hemoglobin A1c 6.5  Rybelsus stopped secondary to nausea  Labs on 01/31/2023 showed hemoglobin A1c 6.8  Labs on 05/05/2023 showed hemoglobin A1c 6.8    Multiple myeloma  Lambda light change, w/ renal disease  s/p autologous stem cell transplant, thalidomide and aredia chemotherapy  In Remission, was on Ninlaro maintenance  02-23-2020 No changes  05-17-2020 No changes.  06-14-2020 No changes  12-2020 Dr Herd has resumed care as they could not find anyone to cover. Patient will not need Ninlaro this year.  [id number] Saw Dr Herd. He is definitely retiring. Will be seeing Dr Lovina Reach.  03-5408 Saw Dr Vena Austria. No changes.  81-1914 Saw Dr Vena Austria. No changes. Will see [facility] tomorrow.  01-2022 Bone Scan 1. Negative exam for fractures, lytic or blastic lesions in the skull, spine, ribs, pelvis, humeri and femora.  2. 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  [id number] no changes  07-2023 no changes    Mixed hyperlipidemia  Does not tolerate statins except for Livalo.  Insurance will not cover Livalo  Have tried all available non-statin oral  agents, and all of proven ineffective  With diabetes he does meet the criteria for the injectables, but Cardiology does not want him on them  Labs on 02-04-2019 showed total cholesterol 244, triglycerides 280, HDL 30, direct LDL 132  Labs on 05-24-2019 showed total cholesterol 273, triglycerides 701, HDL 25, direct LDL 117  Labs on 09-06-2019 showed total cholesterol 203, triglycerides 324, HDL 33, direct LDL 17  Labs on 12-13-2019 showed total cholesterol 212,  triglycerides 217, HDL 32, direct LDL 144  Labs on 06-08-2020 showed total cholesterol 227, triglycerides 179, HDL 34, direct LDL 160  08-2020 Dr Aretha Parrot resume Zetia despite lack of efficacy in prior attempt  Labs on 12-15-2020 showed total cholesterol 179, triglycerides 125, HDL 38, direct LDL 114  Labs on 03-16-2021 showed total cholesterol 192, triglycerides 102, HDL 47, calculated LDL 125  Labs on 07-04-2021 showed total cholesterol 210, triglycerides 130, HDL 38, direct LDL 152  Labs on 01-07-2022 showed total cholesterol 230, triglycerides 206, HDL 37,   Labs on 06/26/2022 showed total cholesterol 228, triglycerides 271, HDL 34, direct LDL 170  Labs on 10/31/2022 showed total cholesterol 230, triglycerides 150, HDL 36, direct LDL 170  Labs on 01/31/2023 showed total cholesterol 186, triglycerides 117, HDL 37, direct LDL 143  Labs on 05/25/2023 showed total cholesterol 223, triglycerides 68, HDL 45, direct LDL 186    Hypothyroidism  On Synthroid 100 mcg  Labs on 02-04-2019 showed TSH 1.58 on Synthroid 100 mcg  Labs on 05-24-2019 showed TSH 3.83 on Synthroid 100 mcg  Labs on 09-06-2019 showed TSH 0.90 on Synthroid 100 mcg  Labs on 12-13-2019 showed TSH 1.28 on Synthroid 100 mcg  Labs on 06-08-2020 showed TSH 1.53 on Synthroid 100 mcg  Labs on 12-15-2020 showed TSH 3.46 on Synthroid 100 mcg  Labs on 03-16-2021 showed TSH 3.05 on Synthroid 100 mcg  Labs on 07-04-2021 showed TSH 3.05 on Synthroid 100 mcg  Labs on 01-07-2022 showed TSH 0.81 on Synthroid 100  mcg  Labs on 11/18/2022 showed TSH 0.97 on Synthroid 100 mcg  Labs on 01/31/2023 showed TSH 1.04 on Synthroid 100 mcg  Labs on 05/25/2023 showed TSH 1.70 on Synthroid 100 mcg    TIA  Occurred 06-2020  On aspirin and Zetia  Follow with Dr. Ramond Dial    Gout  No flares since last visit  Labs on 09-06-2019 showed uric acid 5.8  Labs on 06-08-2020 showed uric acid 5.6  Labs on 12-15-2020 showed uric acid 5.8  Labs on 03-16-2021 showed uric acid 5.0  Labs on 07-04-2021 showed uric acid 4.7  Labs on 10/31/2022 showed uric acid 5.7  Labs on 01/31/2023 showed uric acid 3.9    Chronic nausea  On Zofran    Osteoarthritis/Low Back Pain/Sciatica  Known Disc Degeneration, had a spinal block about 6 years ago. Dr Virl Axe.  Previously on hydrocodone from oncology for pain associated with multiple myeloma.  Has since been weaned off  Currently on Flexeril  Resumed course of physical therapy  09-2019 on codeine from Dr Tobin Chad  12-2019 Going to Dr Wyline Mood in [facility] for consultation on 27th. He had an initial interview with NP last month. Suspects will have surgery  01-27-2020 Had surgery with Dr Wyline Mood. Doing well. Is having physical therapy with Dr Jules Husbands.  [id number] Knee pain has improved. Still having popping and cracking.  [id number] Saw Neurosurgery. They are planning on referring for injections  04-2023 had injection  05-2023 Plans on repeating in a few months. Switched to Lyrica  07-2023 States he does not want further surgery    Asthma  Following with pulmonology    Edema  On lasix    Peripheral vascular disease  Doing well with pentoxifylline    GERD  Doing well with Pepcid    Chronic kidney disease stage II-III  Labs on 09-06-2019 showed BUN 37, creatinine 1.5, GFR 51  Labs on 12-13-2019 showed BUN 23, creatinine 1.3, GFR greater than 60  Labs  on 06-10-2020 showed BUN 20, creatinine 1.4, GFR 55  Labs on 12-15-2020 showed BUN 28, creatinine 1.3, GFR greater than 60  Labs on 03-16-2021 showed BUN 28, creatinine 1.3, GFR  greater than 60  Labs on 09-10-2021 showed BUN 31, creatinine 1.5, GFR 51  Labs on 01-07-2022 showed BUN 24, creatinine 1.2, GFR greater than 60  Labs on 06/26/2022 showed BUN 32, creatinine 1.47, GFR 54  Labs on 10/31/2022 showed BUN 27, creatinine 1.26, GFR 65  Labs on 01/31/2023 showed BUN 22, creatinine 1.38, GFR 58    ROS:  10 systems reviewed and were negative except as noted.   + edema  + nausea  + abnormal gait, back pain, joint pain, muscle cramps, muscle weakness, numbness, tingling    Objective :  BP 130/72 (Site: Left Arm, Patient Position: Sitting, Cuff Size: Adult)   Pulse 76   Resp 18   Ht 1.803 m (5\' 11" )   Wt 103 kg (228 lb)   SpO2 97%   BMI 31.80 kg/m       General:  appears chronically ill and moderately obese  Lungs:  Clear to auscultation bilaterally.   Cardiovascular:  regular rate and rhythm  Neurologic:  Grossly normal  Psychiatric:  Normal    Data reviewed:  Heme/Onc visit - no change. F/U in September  Heme/Onc labs  Neurosurgery note - switched to Lyrica    Assessment/Plan:  Assessment/Plan   1. Benign hypertension    2. Mixed hyperlipidemia    3. Type 2 diabetes mellitus without complication, without long-term current use of insulin (CMS HCC)    4. Acquired hypothyroidism    5. Peripheral vascular disease (CMS HCC)    6. Renal artery stenosis (CMS HCC)    7. Mild intermittent asthma without complication    8. 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 TIA (transient ischemic attack)    10. Gastroesophageal reflux disease without esophagitis    11. Chronic nausea    12. 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)    13. Primary osteoarthritis involving multiple joints    14. Chronic bilateral low back pain with bilateral sciatica    15. 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 Statin intolerance        Diabetes  Monitor  Repeat labs.  He wants to adjust diet instead of meds if possible.   He does not like diabetic shoes. Actually caused more foot pain.     Hypertension  Continue diuretics  Follow with nephrology/cardiology    Multiple myeloma  In remission  Follow with oncology    Mixed hyperlipidemia  Monitor    Hypothyroidism  Continue Synthroid    Nausea  Continue Zofran    Arthritis/back pain/sciatica  Continue Flexeril  Follow with Neurosurgery    Gout  Continue allopurinol    Asthma  Continue follow-up with pulmonology    Chronic edema  Continue diuretics    GERD  Continue Pepcid    Chronic disease stage III  Monitor    TIA  Follow with neurology    Restless leg  Monitor    Terald Sleeper, DO, [facility]  Internal Medicine

## 2023-08-06 NOTE — Nursing Note (Signed)
Patient is here for his follow up. Patient reports no new problems.

## 2023-08-07 ENCOUNTER — Telehealth (RURAL_HEALTH_CENTER): Payer: Self-pay | Admitting: INTERNAL MEDICINE

## 2023-08-07 NOTE — Telephone Encounter (Signed)
-----   Message from [name] sent at 08/07/2023 12:23 PM EDT -----  A1c slightly better at 6.5, so that is good  He was a bit dehydrated, so try to increase fluids  Cholesterol about the same

## 2023-08-07 NOTE — Telephone Encounter (Signed)
Patient was notified of his lab results

## 2023-08-08 LAB — LDL CHOLESTEROL, DIRECT: LDL DIRECT: 176 mg/dL — ABNORMAL HIGH (ref <100)

## 2023-08-22 ENCOUNTER — Other Ambulatory Visit (RURAL_HEALTH_CENTER): Payer: Self-pay

## 2023-11-06 ENCOUNTER — Encounter (RURAL_HEALTH_CENTER): Payer: Self-pay | Admitting: INTERNAL MEDICINE

## 2023-11-06 ENCOUNTER — Ambulatory Visit (RURAL_HEALTH_CENTER): Payer: Self-pay | Admitting: INTERNAL MEDICINE

## 2023-11-06 ENCOUNTER — Other Ambulatory Visit: Payer: Self-pay

## 2023-11-06 ENCOUNTER — Ambulatory Visit (RURAL_HEALTH_CENTER): Payer: Medicare Other | Attending: INTERNAL MEDICINE | Admitting: INTERNAL MEDICINE

## 2023-11-06 VITALS — BP 138/69 | HR 96 | Temp 97.1°F | Resp 18 | Ht 71.0 in | Wt 223.2 lb

## 2023-11-06 DIAGNOSIS — J0100 Acute maxillary sinusitis, unspecified: Secondary | ICD-10-CM | POA: Insufficient documentation

## 2023-11-06 MED ORDER — AMOXICILLIN 875 MG-POTASSIUM CLAVULANATE 125 MG TABLET
1.0000 | ORAL_TABLET | Freq: Two times a day (BID) | ORAL | 0 refills | Status: DC
Start: 2023-11-06 — End: 2023-12-15

## 2023-11-06 MED ORDER — IPRATROPIUM BROMIDE 42 MCG (0.06 %) NASAL SPRAY
2.0000 | Freq: Three times a day (TID) | NASAL | 0 refills | Status: DC
Start: 2023-11-06 — End: 2024-10-11

## 2023-11-06 NOTE — Progress Notes (Signed)
[facility]  [address]  [place], [place]  [address]  Phone: [phone] Fax: [phone]    Name: Connor Patterson                       Date of Birth: [birth year]   MRN:  [id number]                         Date of visit: 11/06/2023     Chief Complaint: Feel Sick (Sinus congestion )    Current Outpatient Medications   Medication Sig    allopurinoL (ZYLOPRIM) 300 mg Oral Tablet Take 1 tablet by mouth once daily    amoxicillin-pot clavulanate (AUGMENTIN) 875-125 mg Oral Tablet Take 1 Tablet by mouth Twice daily    ascorbic acid, vitamin C, (VITAMIN C) 500 mg Oral Tablet Take 1 Tablet (500 mg total) by mouth    aspirin (ECOTRIN) 325 mg Oral Tablet, Delayed Release (E.C.) Take 1 Tablet (325 mg total) by mouth    baclofen (LIORESAL) 10 mg Oral Tablet Take 1 Tablet (10 mg total) by mouth Three times a day    BREO ELLIPTA 100-25 mcg/dose Inhalation Disk with Device Take 1 Inhalation by inhalation Once a day    chlorhexidine gluconate (PERIDEX) 0.12 % Mucous Membrane Mouthwash 15 mL    cyanocobalamin (VITAMIN B 12) 1,000 mcg Oral Tablet Take 1 Tablet (1,000 mcg total) by mouth    famotidine (PEPCID) 20 mg Oral Tablet Take 1 Tablet (20 mg total) by mouth    furosemide (LASIX) 20 mg Oral Tablet Take 1 Tablet (20 mg total) by mouth    ipratropium bromide (ATROVENT) 42 mcg (0.06 %) Nasal Spray, Non-Aerosol Administer 2 Sprays into affected nostril(s) Three times a day    ketoconazole (NIZORAL) 2 % Cream Apply 1 Application  topically Twice daily    levothyroxine (SYNTHROID) 100 mcg Oral Tablet Take 1 Tablet (100 mcg total) by mouth Once a day    magnesium oxide (MAG-OX) 400 mg Oral Tablet Take 1 Tablet (400 mg total) by mouth    mometasone (NASONEX) 50 mcg/actuation Nasal Spray, Non-Aerosol 1 Spray    pentoxifylline (TRENTAL) 400 mg Oral Tablet Sustained Release Take 1 Tablet (400 mg total) by mouth    polyethylene glycol (MIRALAX) 17 gram/dose Oral Powder Take 3 teaspoons (17 g total) by mouth    pregabalin  (LYRICA) 75 mg Oral Capsule Take 1 Capsule (75 mg total) by mouth Three times a day    spironolactone (ALDACTONE) 100 mg Oral Tablet Take 1 Tablet (100 mg total) by mouth Twice daily    triamcinolone acetonide 0.1 % Ointment APPLY0.5 G OF OINTMENT TOPICALLY TO AFFECTED AREA TWICE DAILY    trimethoprim-sulfamethoxazole (BACTRIM DS) 160-800mg  per tablet Take 1 Tablet (160 mg total) by mouth Once a day    vitamin E 400 unit Oral Capsule Take 1 Capsule (400 Units total) by mouth       Patient Active Problem List    Diagnosis Date Noted    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    Benign hypertension 04/08/2022    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/08/2022    Type 2 diabetes mellitus without complication, without long-term current use of insulin (CMS HCC) 04/08/2022    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) 04/08/2022  Mixed hyperlipidemia 04/08/2022    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    Complaining of cough (clear) and congestion. No fever or body aches. Ongoing 3 days. He was outside helping to assemble a wooden swing set. He had not packed for cold weather.     ROS:  10 systems reviewed and were negative except as noted.   + edema  + nausea  + abnormal gait, back pain, joint pain, muscle cramps, muscle weakness, numbness,  tingling    Objective :  BP 138/69   Pulse 96   Temp 36.2 C (97.1 F) (Tympanic)   Resp 18   Ht 1.803 m (5\' 11" )   Wt 101 kg (223 lb 4 oz)   SpO2 97%   BMI 31.14 kg/m       General:  appears chronically ill and moderately obese  Lungs:  Clear to auscultation bilaterally.   Cardiovascular:  regular rate and rhythm  Neurologic:  Grossly normal  Psychiatric:  Normal  Nasal mucosa edematous, clear rhinorrhea, tender to percussion    Data reviewed:      Assessment/Plan:  Assessment/Plan   1. Acute non-recurrent maxillary sinusitis        Sinus infection  Abx  Add ipratroprium nasal spray        Terald Sleeper, DO, [facility]  Internal Medicine

## 2023-11-06 NOTE — Nursing Note (Signed)
Patient is here with complaints of sinus congestion x 3 days. Patient denies any fever.

## 2023-11-18 ENCOUNTER — Telehealth (RURAL_HEALTH_CENTER): Payer: Self-pay | Admitting: INTERNAL MEDICINE

## 2023-11-18 NOTE — Telephone Encounter (Signed)
Patient called requesting antibiotics be sent in for him. He said he now has chest congestion and a cough and is afraid he will get pneumonia if he does not get it cleared up.

## 2023-11-18 NOTE — Telephone Encounter (Signed)
We did antibiotics less than 2 weeks ago. He should have literally finished them on the 15th.  I dont think giving him more at this time is a good idea.  We can get a chest x-ray if he wants.

## 2023-11-19 NOTE — Telephone Encounter (Signed)
Patient was notified. He did not want the chest x-ray at this time.

## 2023-12-15 ENCOUNTER — Other Ambulatory Visit: Payer: Self-pay

## 2023-12-15 ENCOUNTER — Encounter (INDEPENDENT_AMBULATORY_CARE_PROVIDER_SITE_OTHER): Payer: Self-pay | Admitting: INTERNAL MEDICINE

## 2023-12-15 ENCOUNTER — Ambulatory Visit (INDEPENDENT_AMBULATORY_CARE_PROVIDER_SITE_OTHER): Payer: Medicare Other | Admitting: INTERNAL MEDICINE

## 2023-12-15 VITALS — BP 123/77 | HR 77 | Resp 18 | Ht 71.0 in | Wt 225.1 lb

## 2023-12-15 DIAGNOSIS — Z7984 Long term (current) use of oral hypoglycemic drugs: Secondary | ICD-10-CM

## 2023-12-15 DIAGNOSIS — I129 Hypertensive chronic kidney disease with stage 1 through stage 4 chronic kidney disease, or unspecified chronic kidney disease: Secondary | ICD-10-CM

## 2023-12-15 DIAGNOSIS — Z9484 Stem cells transplant status: Secondary | ICD-10-CM

## 2023-12-15 DIAGNOSIS — Z23 Encounter for immunization: Secondary | ICD-10-CM

## 2023-12-15 DIAGNOSIS — K219 Gastro-esophageal reflux disease without esophagitis: Secondary | ICD-10-CM

## 2023-12-15 DIAGNOSIS — C9001 Multiple myeloma in remission: Secondary | ICD-10-CM

## 2023-12-15 DIAGNOSIS — E039 Hypothyroidism, unspecified: Secondary | ICD-10-CM

## 2023-12-15 DIAGNOSIS — E119 Type 2 diabetes mellitus without complications: Secondary | ICD-10-CM

## 2023-12-15 DIAGNOSIS — G8929 Other chronic pain: Secondary | ICD-10-CM

## 2023-12-15 DIAGNOSIS — N1831 Chronic kidney disease, stage 3a: Secondary | ICD-10-CM

## 2023-12-15 DIAGNOSIS — J4520 Mild intermittent asthma, uncomplicated: Secondary | ICD-10-CM

## 2023-12-15 DIAGNOSIS — E782 Mixed hyperlipidemia: Secondary | ICD-10-CM

## 2023-12-15 DIAGNOSIS — R110 Nausea: Secondary | ICD-10-CM

## 2023-12-15 DIAGNOSIS — M150 Primary generalized (osteo)arthritis: Secondary | ICD-10-CM

## 2023-12-15 DIAGNOSIS — Z789 Other specified health status: Secondary | ICD-10-CM

## 2023-12-15 DIAGNOSIS — I10 Essential (primary) hypertension: Secondary | ICD-10-CM

## 2023-12-15 DIAGNOSIS — M1A3790 Chronic gout due to renal impairment, unspecified ankle and foot, without tophus (tophi): Secondary | ICD-10-CM

## 2023-12-15 DIAGNOSIS — I739 Peripheral vascular disease, unspecified: Secondary | ICD-10-CM

## 2023-12-15 DIAGNOSIS — M87180 Osteonecrosis due to drugs, jaw: Secondary | ICD-10-CM

## 2023-12-15 DIAGNOSIS — Z9481 Bone marrow transplant status: Secondary | ICD-10-CM

## 2023-12-15 MED ORDER — BACLOFEN 10 MG TABLET
10.0000 mg | ORAL_TABLET | Freq: Three times a day (TID) | ORAL | 5 refills | Status: DC
Start: 2023-12-15 — End: 2024-09-20

## 2023-12-15 MED ORDER — ALLOPURINOL 300 MG TABLET
300.0000 mg | ORAL_TABLET | Freq: Every day | ORAL | 1 refills | Status: DC
Start: 2023-12-15 — End: 2024-06-14

## 2023-12-15 NOTE — Progress Notes (Signed)
[facility]  [address] [place], [place]  [address]  Phone: [phone] Fax: [phone]    Name: Connor Patterson                       Date of Birth: [birth year]   MRN:  [id number]                         Date of visit: 12/15/2023     Chief Complaint: Follow Up 3 Months (No new problems)    Current Outpatient Medications   Medication Sig    allopurinoL (ZYLOPRIM) 300 mg Oral Tablet Take 1 tablet by mouth once daily    ascorbic acid, vitamin C, (VITAMIN C) 500 mg Oral Tablet Take 1 Tablet (500 mg total) by mouth    aspirin (ECOTRIN) 325 mg Oral Tablet, Delayed Release (E.C.) Take 1 Tablet (325 mg total) by mouth    baclofen (LIORESAL) 10 mg Oral Tablet Take 1 Tablet (10 mg total) by mouth Three times a day    BREO ELLIPTA 100-25 mcg/dose Inhalation Disk with Device Take 1 Inhalation by inhalation Once a day    chlorhexidine gluconate (PERIDEX) 0.12 % Mucous Membrane Mouthwash 15 mL    cyanocobalamin (VITAMIN B 12) 1,000 mcg Oral Tablet Take 1 Tablet (1,000 mcg total) by mouth    famotidine (PEPCID) 20 mg Oral Tablet Take 1 Tablet (20 mg total) by mouth    furosemide (LASIX) 20 mg Oral Tablet Take 1 Tablet (20 mg total) by mouth    ipratropium bromide (ATROVENT) 42 mcg (0.06 %) Nasal Spray, Non-Aerosol Administer 2 Sprays into affected nostril(s) Three times a day    ketoconazole (NIZORAL) 2 % Cream Apply 1 Application  topically Twice daily    levothyroxine (SYNTHROID) 100 mcg Oral Tablet Take 1 Tablet (100 mcg total) by mouth Once a day    magnesium oxide (MAG-OX) 400 mg Oral Tablet Take 1 Tablet (400 mg total) by mouth    mometasone (NASONEX) 50 mcg/actuation Nasal Spray, Non-Aerosol 1 Spray    pentoxifylline (TRENTAL) 400 mg Oral Tablet Sustained Release Take 1 Tablet (400 mg total) by mouth    polyethylene glycol (MIRALAX) 17 gram/dose Oral Powder Take 3 teaspoons (17 g total) by mouth    pregabalin (LYRICA) 75 mg Oral Capsule Take 100 mg by mouth Three times a day    spironolactone  (ALDACTONE) 100 mg Oral Tablet Take 1 Tablet (100 mg total) by mouth Twice daily    triamcinolone acetonide 0.1 % Ointment APPLY0.5 G OF OINTMENT TOPICALLY TO AFFECTED AREA TWICE DAILY    trimethoprim-sulfamethoxazole (BACTRIM DS) 160-800mg  per tablet Take 1 Tablet (160 mg total) by mouth Once a day    vitamin E 400 unit Oral Capsule Take 1 Capsule (400 Units total) by mouth       Patient Active Problem List    Diagnosis Date Noted    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    Benign hypertension 04/08/2022    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/08/2022    Type 2 diabetes mellitus without complication, without long-term current use of insulin (CMS HCC) 04/08/2022    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) 04/08/2022    Mixed hyperlipidemia 04/08/2022    Acquired hypothyroidism 04/08/2022    TIA (transient ischemic attack)  04/08/2022    Gout 04/08/2022    Chronic nausea 04/08/2022    Osteoarthritis 04/08/2022    Chronic low back pain 04/08/2022    Bilateral sciatica 04/08/2022    Mild intermittent asthma without complication 04/08/2022    Peripheral vascular disease (CMS HCC) 04/08/2022    Gastroesophageal reflux disease without esophagitis 04/08/2022    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    Patient treated a month ago for sinusitis. He called back 2 days after finishing the abx requesting additional because of chest congestion and a concern for pneumonia. Patient informed that more antibiotics "just in case" was poor management, but we could obtain a CXR to see if he needed them. Patient declined the CXR.     Hypertension  W/ Hx of renal artery stenosis  Following with nephrology/cardiology  Has apt with Dr Renda Rolls next week.      Diabetes mellitus type 2  Currently on glipizide  Eye exam Dr Brooke Dare in March 2024. Everything was good per patient.   Did not tolerate DPP4.  Not a candidate for metformin. Did not tolerate SGLT2 2/2 side pain. GLPs caused nausea.   Labs on 02-04-2019 showed hemoglobin A1c was 10.2.  Glipizide was added at that time  Labs on 05-24-2019 showed hemoglobin A1c 9.9  Patient resistant to starting insulin  09-2019 working on diet. has lost weight.  Labs on 09-06-2019 showed hemoglobin A1c 12.0, micro albumin creatnine ratio <30   * Suggested start Insulin, but patient only will accept oral agents  Labs on 12-13-2019 showed hemoglobin A1c 12.6, microalbumin creatinine ratio less than 30  03-2020 Samples of Jardiance given to patient. Tolerarated so increased to max oral dose.  06-2020 D/C Jardiance due to side pain  Labs on 06-08-2020 showed hemoglobin A1c 8.9   * Started Rybelsus  09-2020 Log 85-191  Labs on 12-15-2020 showed hemoglobin A1c 6.9  Labs on 03-16-2021 showed hemoglobin A1c 7.3  Labs on 09-10-2021 showed hemoglobin A1c 6.8  Labs on 01-07-2022 showed hemoglobin A1c 8.0  Labs on 06/26/2022 showed hemoglobin A1c 7.0, microalbumin creatinine ratio less than 30  Labs on 10/31/2022 showed hemoglobin A1c 6.5  Rybelsus stopped secondary to nausea  Labs on 01/31/2023 showed hemoglobin A1c 6.8  Labs on 05/05/2023 showed hemoglobin A1c 6.8  Labs on 08/26/2023 showed hemoglobin A1c 6.5, microalbumin creatinine ratio less than 30    Multiple myeloma  Lambda light change, w/ renal disease  s/p autologous stem cell transplant, thalidomide and aredia chemotherapy  In Remission, was on Ninlaro maintenance  02-23-2020 No changes  05-17-2020 No changes.  06-14-2020 No changes  12-2020 Dr Herd has resumed care as they could not find anyone to cover. Patient will not need Ninlaro this year.  [id number] Saw Dr Herd. He is definitely retiring. Will be seeing Dr Lovina Reach.  19-1478 Saw Dr Vena Austria. No changes.  [id number] Saw Dr Vena Austria. No  changes. Will see [facility] tomorrow.  01-2022 Bone Scan 1. Negative exam for fractures, lytic or blastic lesions in the skull, spine, ribs, pelvis, humeri and femora.  2. 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  [id number] no changes  07-2023 no changes    Mixed hyperlipidemia  Does not tolerate statins except for Livalo.  Insurance will not cover Livalo  Have tried all available non-statin oral agents,  and all of proven ineffective  With diabetes he does meet the criteria for the injectables, but Cardiology does not want him on them  Labs on 02-04-2019 showed total cholesterol 244, triglycerides 280, HDL 30, direct LDL 132  Labs on 05-24-2019 showed total cholesterol 273, triglycerides 701, HDL 25, direct LDL 117  Labs on 09-06-2019 showed total cholesterol 203, triglycerides 324, HDL 33, direct LDL 17  Labs on 12-13-2019 showed total cholesterol 212, triglycerides 217, HDL 32, direct LDL 144  Labs on 06-08-2020 showed total cholesterol 227, triglycerides 179, HDL 34, direct LDL 160  08-2020 Dr Aretha Parrot resume Zetia despite lack of efficacy in prior attempt  Labs on 12-15-2020 showed total cholesterol 179, triglycerides 125, HDL 38, direct LDL 114  Labs on 03-16-2021 showed total cholesterol 192, triglycerides 102, HDL 47, calculated LDL 125  Labs on 07-04-2021 showed total cholesterol 210, triglycerides 130, HDL 38, direct LDL 152  Labs on 01-07-2022 showed total cholesterol 230, triglycerides 206, HDL 37,   Labs on 06/26/2022 showed total cholesterol 228, triglycerides 271, HDL 34, direct LDL 170  Labs on 10/31/2022 showed total cholesterol 230, triglycerides 150, HDL 36, direct LDL 170  Labs on 01/31/2023 showed total cholesterol 186, triglycerides 117, HDL 37, direct LDL 143  Labs on 05/25/2023 showed total cholesterol 223, triglycerides 68, HDL 45, direct LDL 186  Labs on 08/06/2023 showed total cholesterol 219, triglycerides 94, HDL 46, direct LDL 176    Hypothyroidism  On  Synthroid 100 mcg  Labs on 02-04-2019 showed TSH 1.58 on Synthroid 100 mcg  Labs on 05-24-2019 showed TSH 3.83 on Synthroid 100 mcg  Labs on 09-06-2019 showed TSH 0.90 on Synthroid 100 mcg  Labs on 12-13-2019 showed TSH 1.28 on Synthroid 100 mcg  Labs on 06-08-2020 showed TSH 1.53 on Synthroid 100 mcg  Labs on 12-15-2020 showed TSH 3.46 on Synthroid 100 mcg  Labs on 03-16-2021 showed TSH 3.05 on Synthroid 100 mcg  Labs on 07-04-2021 showed TSH 3.05 on Synthroid 100 mcg  Labs on 01-07-2022 showed TSH 0.81 on Synthroid 100 mcg  Labs on 11/18/2022 showed TSH 0.97 on Synthroid 100 mcg  Labs on 01/31/2023 showed TSH 1.04 on Synthroid 100 mcg  Labs on 05/25/2023 showed TSH 1.70 on Synthroid 100 mcg  Labs on 08/26/2023 showed TSH 1.29 on Synthroid 100 mcg    TIA  Occurred 06-2020  On aspirin and Zetia  Follow with Dr. Ramond Dial    Gout  No flares since last visit  Labs on 09-06-2019 showed uric acid 5.8  Labs on 06-08-2020 showed uric acid 5.6  Labs on 12-15-2020 showed uric acid 5.8  Labs on 03-16-2021 showed uric acid 5.0  Labs on 07-04-2021 showed uric acid 4.7  Labs on 10/31/2022 showed uric acid 5.7  Labs on 01/31/2023 showed uric acid 3.9    Chronic nausea  On Zofran    Osteoarthritis/Low Back Pain/Sciatica  Known Disc Degeneration, had a spinal block about 6 years ago. Dr Virl Axe.  Previously on hydrocodone from oncology for pain associated with multiple myeloma.  Has since been weaned off  Currently on Flexeril  Resumed course of physical therapy  09-2019 on codeine from Dr Tobin Chad  12-2019 Going to Dr Wyline Mood in [facility] for consultation on 27th. He had an initial interview with NP last month. Suspects will have surgery  01-27-2020 Had surgery with Dr Wyline Mood. Doing well. Is having physical therapy with Dr Jules Husbands.  [phone] Knee pain has improved. Still having popping and cracking.  [phone]  Saw Neurosurgery. They are planning on referring for injections  04-2023 had injection  05-2023 Plans on repeating in a few  months. Switched to Lyrica  07-2023 States he does not want further surgery    Asthma  Following with pulmonology    Edema  On lasix    Peripheral vascular disease  Doing well with pentoxifylline    GERD  Doing well with Pepcid    Chronic kidney disease stage II-III  Labs on 09-06-2019 showed BUN 37, creatinine 1.5, GFR 51  Labs on 12-13-2019 showed BUN 23, creatinine 1.3, GFR greater than 60  Labs on 06-10-2020 showed BUN 20, creatinine 1.4, GFR 55  Labs on 12-15-2020 showed BUN 28, creatinine 1.3, GFR greater than 60  Labs on 03-16-2021 showed BUN 28, creatinine 1.3, GFR greater than 60  Labs on 09-10-2021 showed BUN 31, creatinine 1.5, GFR 51  Labs on 01-07-2022 showed BUN 24, creatinine 1.2, GFR greater than 60  Labs on 06/26/2022 showed BUN 32, creatinine 1.47, GFR 54  Labs on 10/31/2022 showed BUN 27, creatinine 1.26, GFR 65  Labs on 01/31/2023 showed BUN 22, creatinine 1.38, GFR 58  Labs on 08/26/2023 showed BUN 29, creatinine 1.59, GFR 49    ROS:  10 systems reviewed and were negative except as noted.   + edema  + nausea  + abnormal gait, back pain, joint pain, muscle cramps, muscle weakness, numbness, tingling    Objective :  BP 123/77 (Site: Right Arm, Patient Position: Sitting, Cuff Size: Adult)   Pulse 77   Resp 18   Ht 1.803 m (5\' 11" )   Wt 102 kg (225 lb 2 oz)   SpO2 97%   BMI 31.40 kg/m       General:  appears chronically ill and moderately obese  Lungs:  Clear to auscultation bilaterally.   Cardiovascular:  regular rate and rhythm  Neurologic:  Grossly normal  Psychiatric:  Normal    Data reviewed:  Heme/Onc visit - no change. F/U in April  Neurosurgery note - had thoracic/lumbar medial branch block. Waiting on insurance approval for ablation.   Head and neck surgery - no change    Assessment/Plan:  Assessment/Plan   1. Benign hypertension    2. Mixed hyperlipidemia    3. Type 2 diabetes mellitus without complication, without long-term current use of insulin (CMS HCC)    4. Acquired  hypothyroidism    5. 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 Peripheral vascular disease (CMS HCC)    7. Mild intermittent asthma without complication    8. Gastroesophageal reflux disease without esophagitis    9. Chronic nausea    10. 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 Primary osteoarthritis involving multiple joints    12. Chronic bilateral low back pain with bilateral sciatica    13. 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 Chronic gout due to renal impairment involving foot without tophus, unspecified laterality    15. Stem cells transplant status (CMS HCC)    16. Statin intolerance    17. Status post autologous bone marrow transplantation (CMS HCC)      Diabetes  Monitor  Continue glipizide  He does not like diabetic shoes. Actually caused more foot pain.     Hypertension  Continue diuretics  Follow with nephrology/cardiology    Multiple myeloma  In remission  Follow with oncology    Mixed hyperlipidemia  Monitor    Hypothyroidism  Continue  Synthroid    Nausea  Continue Zofran    Arthritis/back pain/sciatica  Continue Flexeril  Follow with Neurosurgery    Gout  Continue allopurinol    Asthma  Continue follow-up with pulmonology    Chronic edema  Continue diuretics    GERD  Continue Pepcid    Chronic disease stage III  Monitor    TIA  Follow with neurology    Restless leg  Monitor    Terald Sleeper, DO, [facility]  Internal Medicine

## 2023-12-15 NOTE — Nursing Note (Signed)
Patient is here for his follow up. Patient reports he will see Dr. Renda Rolls next week. Patient is also waiting on a spinal ablation on L-4, L-5 , and S-1 @ [facility] with Dr. Randell PatientLuster Landsberg.

## 2023-12-16 ENCOUNTER — Ambulatory Visit: Payer: Medicare Other | Attending: INTERNAL MEDICINE

## 2023-12-16 DIAGNOSIS — E039 Hypothyroidism, unspecified: Secondary | ICD-10-CM | POA: Insufficient documentation

## 2023-12-16 DIAGNOSIS — I10 Essential (primary) hypertension: Secondary | ICD-10-CM | POA: Insufficient documentation

## 2023-12-16 DIAGNOSIS — E119 Type 2 diabetes mellitus without complications: Secondary | ICD-10-CM | POA: Insufficient documentation

## 2023-12-16 DIAGNOSIS — M1A3790 Chronic gout due to renal impairment, unspecified ankle and foot, without tophus (tophi): Secondary | ICD-10-CM | POA: Insufficient documentation

## 2023-12-16 DIAGNOSIS — E782 Mixed hyperlipidemia: Secondary | ICD-10-CM | POA: Insufficient documentation

## 2023-12-16 DIAGNOSIS — N1831 Chronic kidney disease, stage 3a: Secondary | ICD-10-CM | POA: Insufficient documentation

## 2023-12-16 LAB — CBC WITH DIFF
BASOPHIL #: 0 10*3/uL (ref 0.00–0.10)
BASOPHIL %: 1 % (ref 0–1)
EOSINOPHIL #: 0.1 10*3/uL (ref 0.00–0.50)
EOSINOPHIL %: 1 % (ref 1–8)
HCT: 40.1 % (ref 36.7–47.1)
HGB: 13.6 g/dL (ref 12.5–16.3)
LYMPHOCYTE #: 2.3 10*3/uL (ref 1.00–3.00)
LYMPHOCYTE %: 29 % (ref 16–44)
MCH: 34.6 pg — ABNORMAL HIGH (ref 23.8–33.4)
MCHC: 33.9 g/dL (ref 32.5–36.3)
MCV: 102.1 fL — ABNORMAL HIGH (ref 73.0–96.2)
MONOCYTE #: 0.8 10*3/uL (ref 0.30–1.00)
MONOCYTE %: 10 % (ref 5–13)
MPV: 9.1 fL (ref 7.4–11.4)
NEUTROPHIL #: 4.8 10*3/uL (ref 1.85–7.80)
NEUTROPHIL %: 60 % (ref 43–77)
PLATELETS: 205 10*3/uL (ref 140–440)
RBC: 3.93 10*6/uL — ABNORMAL LOW (ref 4.06–5.63)
RDW: 14.7 % (ref 12.1–16.2)
WBC: 8 10*3/uL (ref 3.6–10.2)

## 2023-12-16 LAB — COMPREHENSIVE METABOLIC PNL, FASTING
ALBUMIN/GLOBULIN RATIO: 1.3 (ref 0.8–1.4)
ALBUMIN: 3.9 g/dL (ref 3.5–5.7)
ALKALINE PHOSPHATASE: 88 U/L (ref 34–104)
ALT (SGPT): 20 U/L (ref 7–52)
ANION GAP: 6 mmol/L (ref 4–13)
AST (SGOT): 16 U/L (ref 13–39)
BILIRUBIN TOTAL: 0.4 mg/dL (ref 0.3–1.0)
BUN/CREA RATIO: 20 (ref 6–22)
BUN: 25 mg/dL (ref 7–25)
CALCIUM, CORRECTED: 9.3 mg/dL (ref 8.9–10.8)
CALCIUM: 9.2 mg/dL (ref 8.6–10.3)
CHLORIDE: 107 mmol/L (ref 98–107)
CO2 TOTAL: 23 mmol/L (ref 21–31)
CREATININE: 1.27 mg/dL (ref 0.60–1.30)
ESTIMATED GFR: 64 mL/min/{1.73_m2} (ref >59)
GLOBULIN: 3 (ref 2.0–3.5)
GLUCOSE: 141 mg/dL — ABNORMAL HIGH (ref 74–109)
OSMOLALITY, CALCULATED: 279 mosm/kg (ref 270–290)
POTASSIUM: 4.6 mmol/L (ref 3.5–5.1)
PROTEIN TOTAL: 6.9 g/dL (ref 6.4–8.9)
SODIUM: 136 mmol/L (ref 136–145)

## 2023-12-16 LAB — THYROID STIMULATING HORMONE (SENSITIVE TSH): TSH: 1.098 u[IU]/mL (ref 0.450–5.330)

## 2023-12-16 LAB — LIPID PANEL
CHOL/HDL RATIO: 4.9
CHOLESTEROL: 214 mg/dL — ABNORMAL HIGH (ref <200)
HDL CHOL: 44 mg/dL (ref >=40)
LDL CALC: 158 mg/dL — ABNORMAL HIGH (ref 0–100)
TRIGLYCERIDES: 60 mg/dL (ref <=150)
VLDL CALC: 12 mg/dL (ref 0–50)

## 2023-12-16 LAB — URIC ACID: URIC ACID: 3.9 mg/dL (ref 2.3–7.6)

## 2023-12-16 LAB — HGA1C (HEMOGLOBIN A1C WITH EST AVG GLUCOSE): HEMOGLOBIN A1C: 6.5 % — ABNORMAL HIGH (ref 4.0–6.0)

## 2023-12-17 LAB — LDL CHOLESTEROL, DIRECT: LDL DIRECT: 174 mg/dL — ABNORMAL HIGH (ref <100)

## 2024-01-06 ENCOUNTER — Other Ambulatory Visit (INDEPENDENT_AMBULATORY_CARE_PROVIDER_SITE_OTHER): Payer: Self-pay | Admitting: NURSE PRACTITIONER

## 2024-01-06 DIAGNOSIS — C9000 Multiple myeloma not having achieved remission: Secondary | ICD-10-CM

## 2024-01-08 ENCOUNTER — Other Ambulatory Visit: Payer: Self-pay

## 2024-01-08 ENCOUNTER — Ambulatory Visit (INDEPENDENT_AMBULATORY_CARE_PROVIDER_SITE_OTHER)
Admission: RE | Admit: 2024-01-08 | Discharge: 2024-01-08 | Disposition: A | Discharge status: Home / Self Care | Payer: Medicare Other | Source: Ambulatory Visit | Attending: NURSE PRACTITIONER | Admitting: NURSE PRACTITIONER

## 2024-01-08 ENCOUNTER — Ambulatory Visit: Payer: Medicare Other | Attending: NURSE PRACTITIONER | Admitting: NURSE PRACTITIONER

## 2024-01-08 ENCOUNTER — Encounter (INDEPENDENT_AMBULATORY_CARE_PROVIDER_SITE_OTHER): Payer: Self-pay | Admitting: NURSE PRACTITIONER

## 2024-01-08 VITALS — BP 161/88 | HR 73 | Temp 96.9°F | Ht 71.0 in | Wt 231.4 lb

## 2024-01-08 DIAGNOSIS — M879 Osteonecrosis, unspecified: Secondary | ICD-10-CM | POA: Insufficient documentation

## 2024-01-08 DIAGNOSIS — C9000 Multiple myeloma not having achieved remission: Secondary | ICD-10-CM | POA: Insufficient documentation

## 2024-01-08 DIAGNOSIS — Z9484 Stem cells transplant status: Secondary | ICD-10-CM | POA: Insufficient documentation

## 2024-01-08 LAB — CBC WITH DIFF
BASOPHIL #: 0 10*3/uL (ref 0.00–0.10)
BASOPHIL %: 1 % (ref 0–1)
EOSINOPHIL #: 0.1 10*3/uL (ref 0.00–0.50)
EOSINOPHIL %: 2 % (ref 1–8)
HCT: 41.3 % (ref 36.7–47.1)
HGB: 13.8 g/dL (ref 12.5–16.3)
LYMPHOCYTE #: 2 10*3/uL (ref 1.00–3.20)
LYMPHOCYTE %: 29 % (ref 15–43)
MCH: 34.3 pg — ABNORMAL HIGH (ref 23.8–33.4)
MCHC: 33.5 g/dL (ref 32.5–36.3)
MCV: 102.2 fL — ABNORMAL HIGH (ref 73.0–96.2)
MONOCYTE #: 0.7 10*3/uL (ref 0.30–1.10)
MONOCYTE %: 10 % (ref 6–14)
MPV: 8.6 fL (ref 7.4–11.4)
NEUTROPHIL #: 4.1 10*3/uL (ref 1.70–7.60)
NEUTROPHIL %: 60 % (ref 44–74)
PLATELETS: 183 10*3/uL (ref 140–440)
RBC: 4.04 10*6/uL — ABNORMAL LOW (ref 4.06–5.63)
RDW: 14.5 % (ref 12.1–16.2)
WBC: 6.9 10*3/uL (ref 3.6–10.2)

## 2024-01-08 LAB — COMPREHENSIVE METABOLIC PANEL, NON-FASTING
ALBUMIN/GLOBULIN RATIO: 1.3 (ref 0.8–1.4)
ALBUMIN: 3.9 g/dL (ref 3.5–5.7)
ALKALINE PHOSPHATASE: 85 U/L (ref 34–104)
ALT (SGPT): 19 U/L (ref 7–52)
ANION GAP: 7 mmol/L (ref 4–13)
AST (SGOT): 16 U/L (ref 13–39)
BILIRUBIN TOTAL: 0.4 mg/dL (ref 0.3–1.0)
BUN/CREA RATIO: 20 (ref 6–22)
BUN: 28 mg/dL — ABNORMAL HIGH (ref 7–25)
CALCIUM, CORRECTED: 9.7 mg/dL (ref 8.9–10.8)
CALCIUM: 9.6 mg/dL (ref 8.6–10.3)
CHLORIDE: 105 mmol/L (ref 98–107)
CO2 TOTAL: 24 mmol/L (ref 21–31)
CREATININE: 1.42 mg/dL — ABNORMAL HIGH (ref 0.60–1.30)
ESTIMATED GFR: 56 mL/min/{1.73_m2} — ABNORMAL LOW (ref >59)
GLOBULIN: 3 (ref 2.0–3.5)
GLUCOSE: 149 mg/dL — ABNORMAL HIGH (ref 74–109)
OSMOLALITY, CALCULATED: 280 mosm/kg (ref 270–290)
POTASSIUM: 5.1 mmol/L (ref 3.5–5.1)
PROTEIN TOTAL: 6.9 g/dL (ref 6.4–8.9)
SODIUM: 136 mmol/L (ref 136–145)

## 2024-01-08 NOTE — Cancer Center Note (Signed)
Department of Hematology/Oncology  Progress Note   Name: Connor Patterson  WUJ:[id number]  Date of Birth: 08-28-1961  Encounter Date: 01/08/2024    REFERRING PROVIDER:  Terald Sleeper, DO  [address]  [place],  [place] [address]     REASON FOR OFFICE VISIT:  Follow Up and Multiple Myeloma     HISTORY OF PRESENT ILLNESS:  Connor Patterson is a 63 y.o. male who presents today for follow up of multiple myeloma.  He was originally diagnosed in January 2004, and at that time had renal failure and a respiratory infection his serum electrophoresis showed a monoclonal protein of 0.1 grams/deciliter.  His bone marrow biopsy at that time was consistent with the presence of multiple myeloma, and a skeletal survey showed numerous lytic lesions and he also had an elevated calcium level.     He was being seen at [facility] and had VAD chemotherapy for 4 cycles.  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  He was treated with thalidomide in a maintenance fashion.  He developed osteonecrosis of the jaw in 2013, and his bisphosphonate therapy was discontinued at that time.  He was apparently switch to Revlimid for maintenance treatment, and it was discontinued in 2017 due to poor tolerance.    Since 2018, the patient has been off of any maintenance therapy.  His monoclonal protein levels have been very low and he has been considered to still be in remission.  He is being followed with observation and continues to follow up at [facility].    01/08/2024: The patient is here for follow up of multiple myeloma.  He has been off of maintenance therapy and is having his labs rechecked every 6 months.  He states that he is doing well other than continued back pain. He is following up with a spine specialist at [facility] for this. He states that he is otherwise doing well and denies any other complaints/issues at this time.     ROS:   Pertinent review of systems as discussed in HPI    HISTORY:  Past Medical History:   Diagnosis  Date    Asthma     Diabetes mellitus, type 2 (CMS HCC)     Essential hypertension     Hx of transfusion        Past Surgical History:   Procedure Laterality Date    HX BACK SURGERY      3 times    HX COLONOSCOPY      LIMBAL STEM CELL TRANSPLANT      RENAL BIOPSY, OPEN         Social History     Socioeconomic History    Marital status: Married     Spouse name: Connor Patterson    Number of children: 2    Years of education: Not on file    Highest education level: Not on file   Occupational History    Not on file   Tobacco Use    Smoking status: Never    Smokeless tobacco: Former   Haematologist status: Never Used   Substance and Sexual Activity    Alcohol use: Never    Drug use: Never    Sexual activity: Not on file   Other Topics Concern    Not on file   Social History Narrative    Not on file     Social Determinants of Health     Financial Resource Strain: Not on file  Transportation Needs: No Transportation Needs (05/12/2023)    [facility]    Trans[facility]     In the past 12 months, has lack of reliable transportation kept you from medical appointments, meetings, work or from getting things needed for daily living? : No   Social Connections: Not on file   Intimate Partner Violence: Not on file   Housing Stability: Low Risk  (11/12/2023)    Received from [facility]    Housing Stability Vital Sign     What is your living situation today?: I have a steady place to live     Think about the place you live. Do you have problems with any of the following? Choose all that apply:: None/None on this list     Family Medical History:       Problem Relation (Age of Onset)    Autoimmune disease Mother    Carotid Stenosis Mother    Hodgkin's lymphoma Father            Current Outpatient Medications   Medication Sig    allopurinoL (ZYLOPRIM) 300 mg Oral Tablet Take 1 Tablet (300 mg total) by mouth Once a day    ascorbic acid, vitamin C, (VITAMIN C) 500 mg Oral Tablet Take 1 Tablet (500 mg total) by  mouth    aspirin (ECOTRIN) 325 mg Oral Tablet, Delayed Release (E.C.) Take 1 Tablet (325 mg total) by mouth    baclofen (LIORESAL) 10 mg Oral Tablet Take 1 Tablet (10 mg total) by mouth Three times a day    BREO ELLIPTA 100-25 mcg/dose Inhalation Disk with Device Take 1 Inhalation by inhalation Once a day    chlorhexidine gluconate (PERIDEX) 0.12 % Mucous Membrane Mouthwash 15 mL    cyanocobalamin (VITAMIN B 12) 1,000 mcg Oral Tablet Take 1 Tablet (1,000 mcg total) by mouth    famotidine (PEPCID) 20 mg Oral Tablet Take 1 Tablet (20 mg total) by mouth    furosemide (LASIX) 20 mg Oral Tablet Take 1 Tablet (20 mg total) by mouth    ipratropium bromide (ATROVENT) 42 mcg (0.06 %) Nasal Spray, Non-Aerosol Administer 2 Sprays into affected nostril(s) Three times a day    ketoconazole (NIZORAL) 2 % Cream Apply 1 Application  topically Twice daily    levothyroxine (SYNTHROID) 100 mcg Oral Tablet Take 1 Tablet (100 mcg total) by mouth Once a day    magnesium oxide (MAG-OX) 400 mg Oral Tablet Take 1 Tablet (400 mg total) by mouth    mometasone (NASONEX) 50 mcg/actuation Nasal Spray, Non-Aerosol 1 Spray    pentoxifylline (TRENTAL) 400 mg Oral Tablet Sustained Release Take 1 Tablet (400 mg total) by mouth    polyethylene glycol (MIRALAX) 17 gram/dose Oral Powder Take 3 teaspoons (17 g total) by mouth    pregabalin (LYRICA) 75 mg Oral Capsule Take 100 mg by mouth Three times a day    spironolactone (ALDACTONE) 100 mg Oral Tablet Take 1 Tablet (100 mg total) by mouth Twice daily    triamcinolone acetonide 0.1 % Ointment APPLY0.5 G OF OINTMENT TOPICALLY TO AFFECTED AREA TWICE DAILY    trimethoprim-sulfamethoxazole (BACTRIM DS) 160-800mg  per tablet Take 1 Tablet (160 mg total) by mouth Once a day    vitamin E 400 unit Oral Capsule Take 1 Capsule (400 Units total) by mouth     No Known Allergies    PHYSICAL EXAM:  BP (!) 161/88 (Site: Left Arm, Patient Position: Sitting, Cuff Size: Adult)   Pulse 73  Temp 36.1 C (96.9 F)  (Thermal Scan)   Ht 1.803 m (5\' 11" )   Wt 105 kg (231 lb 6.4 oz)   SpO2 99%   BMI 32.27 kg/m        ECOG Status: (0) Fully active, able to carry on all predisease performance without restriction   Physical Exam  Vitals reviewed.   Constitutional:       Appearance: He is obese.   Eyes:      Pupils: Pupils are equal, round, and reactive to light.   Cardiovascular:      Rate and Rhythm: Normal rate and regular rhythm.      Heart sounds: Normal heart sounds. No murmur heard.  Pulmonary:      Effort: Pulmonary effort is normal.   Musculoskeletal:         General: Normal range of motion.      Cervical back: Normal range of motion.   Lymphadenopathy:      Head:      Right side of head: No submental, submandibular, tonsillar, preauricular, posterior auricular or occipital adenopathy.      Left side of head: No submental, submandibular, tonsillar, preauricular, posterior auricular or occipital adenopathy.      Cervical: No cervical adenopathy.      Right cervical: No superficial, deep or posterior cervical adenopathy.     Left cervical: No superficial, deep or posterior cervical adenopathy.      Upper Body:      Right upper body: No supraclavicular or axillary adenopathy.      Left upper body: No supraclavicular or axillary adenopathy.      Lower Body: No right inguinal adenopathy. No left inguinal adenopathy.   Skin:     General: Skin is warm and dry.      Capillary Refill: Capillary refill takes less than 2 seconds.   Neurological:      General: No focal deficit present.      Mental Status: He is alert and oriented to person, place, and time. Mental status is at baseline.      Motor: Motor function is intact.      Coordination: Coordination is intact.      Gait: Gait is intact.   Psychiatric:         Attention and Perception: Attention normal.         Mood and Affect: Mood and affect normal.         Speech: Speech normal.         Behavior: Behavior normal.         Thought Content: Thought content normal.          Cognition and Memory: Cognition normal.         Judgment: Judgment normal.         LABS:   CBC  Diff   Lab Results   Component Value Date/Time    WBC 6.9 01/08/2024 09:36 AM    HGB 13.8 01/08/2024 09:36 AM    HCT 41.3 01/08/2024 09:36 AM    PLTCNT 183 01/08/2024 09:36 AM    RBC 4.04 (L) 01/08/2024 09:36 AM    MCV 102.2 (H) 01/08/2024 09:36 AM    MCHC 33.5 01/08/2024 09:36 AM    MCH 34.3 (H) 01/08/2024 09:36 AM    RDW 14.5 01/08/2024 09:36 AM    MPV 8.6 01/08/2024 09:36 AM    Lab Results   Component Value Date/Time    PMNS 60 01/08/2024 09:36 AM    LYMPHOCYTES 29  01/08/2024 09:36 AM    EOSINOPHIL 2 01/08/2024 09:36 AM    MONOCYTES 10 01/08/2024 09:36 AM    BASOPHILS 1 01/08/2024 09:36 AM    BASOPHILS 0.00 01/08/2024 09:36 AM    PMNABS 4.10 01/08/2024 09:36 AM    LYMPHSABS 2.00 01/08/2024 09:36 AM    EOSABS 0.10 01/08/2024 09:36 AM    MONOSABS 0.70 01/08/2024 09:36 AM            Comprehensive Metabolic Profile    Lab Results   Component Value Date    SODIUM 136 01/08/2024    POTASSIUM 5.1 01/08/2024    CHLORIDE 105 01/08/2024    CO2 24 01/08/2024    ANIONGAP 7 01/08/2024    BUN 28 (H) 01/08/2024    CREATININE 1.42 (H) 01/08/2024    ALBUMIN 3.9 01/08/2024    CALCIUM 9.6 01/08/2024    GLUCOSENF 149 (H) 01/08/2024    ALKPHOS 85 01/08/2024    ALT 19 01/08/2024    AST 16 01/08/2024    TOTBILIRUBIN 0.4 01/08/2024    TOTALPROTEIN 6.9 01/08/2024       ASSESSMENT:      ICD-10-CM    1. Multiple myeloma, remission status unspecified (CMS HCC)  C90.00              PLAN:   1. Multiple myeloma:  The patient is status post VAD induction, stem cell transplant, thalidomide maintenance followed by lenalidomide maintenance.  He has been on observation since 2018 and has not had any evidence of recurrence since that time.  We are basically following him as an [facility] patient, and we will recheck laboratory studies every 6 months.  He will have myeloma specific lab studies completed today as ordered.  2. Osteonecrosis of the jaw:  No further  bisphosphonate therapy is planned.    Connor Patterson was given the chance to ask questions, and these were answered to their satisfaction. The patient is welcome to call with any questions or concerns in the meantime.       On the day of the encounter, a total of 30 minutes was spent on this patient encounter including review of historical information, examination, documentation and post-visit activities.   Return in about 3 months (around 04/06/2024).   Benjaman Pott, APRN,FNP-BC ,01/08/2024 ,10:54     The patient's insurance company bears full legal and financial responsibility resulting from any deviations that they cause to my recommended treatment plan.   CC:  Mardee Postin Rolette, DO  [address]  [place] [address]    Terald Sleeper, DO  [address]  [place],  [place] [address]    This note was partially generated using MModal Fluency Direct system, and there may be some incorrect words, spellings, and punctuation that were not noted in checking the note before saving.

## 2024-01-09 LAB — MONOCLONAL GAMMOPATHY PROFILE WITH IMMUNOTYPING REFLEX
ALBUMIN: 3.6 g/dL (ref 3.4–4.8)
KAPPA FREE LIGHT CHAINS: 2.93 mg/dL (ref 1.25–3.25)
KAPPA/LAMBDA FLC RATIO: 1.26 (ref 0.80–2.10)
LAMBDA FREE LIGHT CHAINS: 2.32 mg/dL (ref 0.60–2.70)
TOTAL PROTEIN: 6.3 g/dL

## 2024-01-09 LAB — ALBUMIN FOR ELECTROPHORESIS: ALBUMIN: 3.6 g/dL (ref 3.4–4.8)

## 2024-01-09 LAB — KAPPA AND LAMBDA FREE LIGHT CHAINS, SERUM
KAPPA FREE LIGHT CHAINS: 2.93 mg/dL (ref 1.25–3.25)
KAPPA/LAMBDA FLC RATIO: 1.26 (ref 0.80–2.10)
LAMBDA FREE LIGHT CHAINS: 2.32 mg/dL (ref 0.60–2.70)

## 2024-01-09 LAB — PROTEIN FOR ELECTROPHORESIS: PROTEIN TOTAL: 6.3 g/dL (ref 5.6–7.6)

## 2024-02-07 ENCOUNTER — Other Ambulatory Visit (RURAL_HEALTH_CENTER): Payer: Self-pay | Admitting: INTERNAL MEDICINE

## 2024-03-10 ENCOUNTER — Encounter (INDEPENDENT_AMBULATORY_CARE_PROVIDER_SITE_OTHER): Payer: Self-pay | Admitting: INTERNAL MEDICINE

## 2024-03-10 ENCOUNTER — Other Ambulatory Visit: Payer: Self-pay

## 2024-03-10 ENCOUNTER — Ambulatory Visit (INDEPENDENT_AMBULATORY_CARE_PROVIDER_SITE_OTHER): Admitting: INTERNAL MEDICINE

## 2024-03-10 VITALS — BP 151/80 | HR 73 | Resp 18 | Ht 71.0 in | Wt 215.4 lb

## 2024-03-10 DIAGNOSIS — J40 Bronchitis, not specified as acute or chronic: Secondary | ICD-10-CM

## 2024-03-10 DIAGNOSIS — J0100 Acute maxillary sinusitis, unspecified: Secondary | ICD-10-CM

## 2024-03-10 MED ORDER — LEVOFLOXACIN 750 MG TABLET
750.0000 mg | ORAL_TABLET | Freq: Every day | ORAL | 0 refills | Status: DC
Start: 2024-03-10 — End: 2024-04-09

## 2024-03-10 NOTE — Nursing Note (Signed)
 Patient is here with c/o chest congestion and cough with greenish phlegm x 5 days.

## 2024-03-10 NOTE — Progress Notes (Signed)
 [facility]  [address] [place], [place]  [address]  Phone: [phone] Fax: [phone]    Name: Connor Patterson                       Date of Birth: Apr 24, 1961   MRN:  [id number]                         Date of visit: 03/10/2024     Chief Complaint: Feel Sick (C/o chest congestion and cough with greenish phlegm x 5 days)    Current Outpatient Medications   Medication Sig    allopurinoL (ZYLOPRIM) 300 mg Oral Tablet Take 1 Tablet (300 mg total) by mouth Once a day    ascorbic acid, vitamin C, (VITAMIN C) 500 mg Oral Tablet Take 1 Tablet (500 mg total) by mouth    aspirin (ECOTRIN) 325 mg Oral Tablet, Delayed Release (E.C.) Take 1 Tablet (325 mg total) by mouth    baclofen (LIORESAL) 10 mg Oral Tablet Take 1 Tablet (10 mg total) by mouth Three times a day    BREO ELLIPTA 100-25 mcg/dose Inhalation Disk with Device Take 1 Inhalation by inhalation Once a day    chlorhexidine gluconate (PERIDEX) 0.12 % Mucous Membrane Mouthwash 15 mL    cyanocobalamin (VITAMIN B 12) 1,000 mcg Oral Tablet Take 1 Tablet (1,000 mcg total) by mouth    famotidine (PEPCID) 20 mg Oral Tablet Take 1 Tablet (20 mg total) by mouth    furosemide (LASIX) 20 mg Oral Tablet Take 1 Tablet (20 mg total) by mouth    ipratropium bromide (ATROVENT) 42 mcg (0.06 %) Nasal Spray, Non-Aerosol Administer 2 Sprays into affected nostril(s) Three times a day    ketoconazole (NIZORAL) 2 % Cream Apply 1 Application  topically Twice daily    levoFLOXacin (LEVAQUIN) 750 mg Oral Tablet Take 1 Tablet (750 mg total) by mouth Daily for 7 days    levothyroxine (SYNTHROID) 100 mcg Oral Tablet Take 1 tablet by mouth once daily    magnesium oxide (MAG-OX) 400 mg Oral Tablet Take 1 Tablet (400 mg total) by mouth    mometasone (NASONEX) 50 mcg/actuation Nasal Spray, Non-Aerosol 1 Spray    pentoxifylline (TRENTAL) 400 mg Oral Tablet Sustained Release Take 1 Tablet (400 mg total) by mouth    polyethylene glycol (MIRALAX) 17 gram/dose Oral Powder Take 3  teaspoons (17 g total) by mouth    pregabalin (LYRICA) 75 mg Oral Capsule Take 100 mg by mouth Three times a day    spironolactone (ALDACTONE) 100 mg Oral Tablet Take 1 Tablet (100 mg total) by mouth Twice daily    triamcinolone acetonide 0.1 % Ointment APPLY0.5 G OF OINTMENT TOPICALLY TO AFFECTED AREA TWICE DAILY    trimethoprim-sulfamethoxazole (BACTRIM DS) 160-800mg  per tablet Take 1 Tablet (160 mg total) by mouth Daily    vitamin E 400 unit Oral Capsule Take 1 Capsule (400 Units total) by mouth       Patient Active Problem List    Diagnosis Date Noted    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    Benign hypertension 04/08/2022    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/08/2022    Type 2 diabetes mellitus without complication, without long-term current use of insulin 04/08/2022    Multiple myeloma in  remission (CMS HCC) 04/08/2022    Mixed hyperlipidemia 04/08/2022    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     Complaining of cough (green) and congestion for almost a week. No fever or chills. No dyspnea.   Has been traveling quite a bit in the last month.     ROS:  10 systems reviewed and were negative except as noted.   + edema  + nausea  + abnormal gait, back pain, joint pain, muscle cramps, muscle weakness, numbness,  tingling    Objective :  BP (!) 151/80 (Site: Left Arm, Patient Position: Sitting, Cuff Size: Adult Large)   Pulse 73   Resp 18   Ht 1.803 m (5\' 11" )   Wt 97.7 kg (215 lb 6.4 oz)   SpO2 96%   BMI 30.04 kg/m       General:  appears chronically ill and moderately obese  Lungs:  wheezes throughout all lung fields  Cardiovascular:  regular rate and rhythm, S1, S2 normal, no murmur, click, rub or gallop  Neurologic:  Grossly normal. Alert and oriented x3  Psychiatric:  Normal    Data reviewed:      Assessment/Plan:  Assessment/Plan   1. Acute non-recurrent maxillary sinusitis    2. Bronchitis      Abx  Patient already has cough medication      Terald Sleeper, DO, [facility]  Internal Medicine

## 2024-03-16 ENCOUNTER — Encounter (INDEPENDENT_AMBULATORY_CARE_PROVIDER_SITE_OTHER): Payer: Self-pay | Admitting: INTERNAL MEDICINE

## 2024-03-23 NOTE — Progress Notes (Signed)
 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 Bartol is a 63 y.o. male who was diagnosed with multiple myeloma in January 2004 after he presented to the emergency department with respiratory infection and was found to be in acute renal failure with a creatinine of 4.1.   A serum protein electrophoresis demonstrated 0.1 gram monoclonal spike.   Urine protein elctrophoresis revealed a monoclonal free lambda light chain with a protein of 3.1 g/24-hours.   Bone marrow biopsy was consistent with plasma cell neoplasm with normal cytogenetics.   Skeletal survey was negative for lytic lesions.   Serum calcium was 9.4 mg/dl  LDH 102  beta-2 microglobulin elevated at 6.   Quantitative immunoglobulins abnormal with IgG 365, IgA 14 and IgM 1    TREATMENT HISTORY:   VAD x 4 cycles  Autologous stem cell transplant  Stem cell mobilization using Cytoxan 1.5 gm/m2 every 3 hours x 3 doses  Preparative regiment - Melphalan (100 mg/m2 x 2 days)   Day 0 = 07/12/2003    POST-TRANSPLANT TREATMENT:  09/19/2003: Day +60 post transplant bone marrow biopsy and aspirate showed normal cellular marrow with no evidence for multiple myeloma. No M-spike was seen in the peripheral blood but a 24-hour urine for protein was not done. He was placed on thalidomide 50 mg daily.   01/04/2008: Underwent renal duplex which showed bilateral renal artery stenosis with restrictive index of 0.6 on the right, 0.65 on the left. He underwent exploratory laparotomy with bilateral renal vein  aortorenal bypass with reverse saphenous vein for severe hypertension, renal insufficiency associated with ACE inhibitors on 02/23/2008.  09/2009: Discontinued thalidomide maintenance and Aredia.  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  09/2015: Restarted lenalidomide 15 mg D1-21 of each 28-day cycle due to slowly rising FLC ratio.  01/2016: Lenalidomide held due to poor tolerance - vomiting, hypokalemia, thrombocytopenia, weight loss.  03/11/2016: Restarted lenalidomide 5 mg D1-21 of each 28-day cycle.  09/16/2016: Discontinued lenalidomide due to continued poor tolerance.  02/10/2017: Transitioned to ixazomib 2.3 mg D1/8/15 of each 28-day cycle.  12/29/2019: Underwent restaging bone marrow biopsy for consideration of discontinuation of maintenance. Marrow showed 5-10% polytypic plasma cells, normal male karyotype, FISH myeloma panel negative, MRD negative (0.0000%) by flow to [facility].  01/06/2020: Restaging PET/CT scan (OSH) with left mandibular lesions consistent with history of ONJ, focal area of soft tissue uptake of left shoulder likely associated with bursitis, no other osseous lesions.    INTERVAL HISTORY:  Mr. Kyreece Lehman returns to clinic with his wife for continued follow-up of his free lambda myeloma, currently on surveillance. He reports he is doing fair overall. He continues to experience low back pain with left lower extremity radiculopathy and intermittent weakness of his bilateral lower extremities. He most recently underwent right lumbar medial branch RFA of L4-L5 and L5-S1 with Dr. [name]. He unfortunately experienced a fall about a week ago, during which he was wearing bedroom slippers outside that caught  on a piece of furniture. He landed on his hands as well as his right lateral arm and right lateral thigh. He has notable bruising in these areas with lasting pain of his right upper arm. He reports weakness in his right hand when he tries to lift objects, like  a coffee mug, but feels this is improving in severity already compared to a week ago. 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 He reports ongoing bilateral lower extremity edema that is somewhat adequately managed with spironolactone. 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 He denies fevers/chills, night sweats, headaches, dizziness, shortness of breath, nausea/vomiting, diarrhea, new-onset bone pains, or arthralgias.     REVIEW OF SYSTEMS:  A full 12 system ROS was obtained and was negative unless specified above.      ECOG/Zubrod Performance Status: 1 - Strenous physical activity restricted; fully ambulatory and able to carry out light work KPS (80-70%)    PAIN SCORE: 0-Zero    PAST MEDICAL HISTORY:    Active Ambulatory Problems     Diagnosis Date Noted   . Multiple myeloma (HCC) 01/21/2012   . 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 Low back pain 11/01/2019   . Spinal stenosis of lumbar region with neurogenic claudication 12/29/2019   . S/P lumbar laminectomy 12/29/2019   . 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 Essential hypertension      Resolved Ambulatory Problems     Diagnosis Date Noted   . No Resolved Ambulatory Problems     Past Medical History:   Diagnosis Date   . Arthritis    . Asthma (CMD)    . Renal artery stenosis    . Thyroid  disease      PAST SURGICAL HISTORY:   Past Surgical History:   Procedure Laterality Date   . ABDOMINAL SURGERY       Procedure: ABDOMINAL SURGERY; Renal Artery Stenosis (blockage in kidney)   . BACK SURGERY  2011    Procedure: BACK SURGERY; L4-5    . HERNIA REPAIR      Procedure: HERNIA  REPAIR; Umbilical Hernia Repair w/ Mesh   . LUMBAR LAMINECTOMY Bilateral 01/27/2020    Procedure: LUMBAR LAMINECTOMY MULTI LEVEL  L2-5, Lumbar reexploration L3-L4, Decompression L2-L3, L4-L5;  Surgeon: Mayra Specter Gust Leghorn., MD;  Location: [facility] MAIN OR;  Service: Neurosurgery;  Laterality: Bilateral;   . OTHER SURGICAL HISTORY      Procedure: OTHER SURGICAL HISTORY (PERIPHERAL  STEM CELL TRANSPLANT)   . ROOT CANAL      Procedure: ROOT CANAL     ALLERGIES:  No Known Allergies    MEDICATIONS:  Current Outpatient Medications   Medication Instructions   . acyclovir (ZOVIRAX) 400 mg tablet TAKE ONE TABLET BY MOUTH TWICE DAILY   . albuterol 2.5 mg /3 mL (0.083 %) nebulizer solution 1 ampule, Every 6 hours PRN   . allopurinoL  (ZYLOPRIM ) 300 mg, Daily   . amoxicillin  (AMOXIL ) 500 mg capsule    . ascorbic acid (VITAMIN C) 500 mg, Daily   . aspirin (ECOTRIN) 325 mg, Daily   . baclofen  (LIORESAL ) 10 mg, 3 times daily   .  chlorthalidone (HYGROTON) 25 mg, Daily   . cyclobenzaprine  (FLEXERIL ) 5 mg, 3 times daily PRN   . famotidine (PEPCID) 20 mg, Daily   . fluticasone furoate-vilanteroL (Breo Ellipta ) 100-25 mcg/dose dsdv inhaler 1 puff, Daily PRN   . furosemide  (LASIX ) 20 mg, Daily PRN   . ibuprofen (MOTRIN) 200 mg, Every 6 hours PRN   . ipratropium (ATROVENT ) 42 mcg (0.06 %) spry nasal spray 2 sprays   . ketoconazole (NIZORAL) 2 % cream    . L.acid-L.casei-B.bif-B.lon-FOS (Probiotic Blend) 2 billion cell-50 mg cap 1 capsule, Daily PRN   . levothyroxine  (SYNTHROID) 100 mcg, Nightly   . magnesium oxide 400 mg, Daily   . mometasone (Nasonex) 50 mcg/actuation spry spray 1 spray, Daily PRN   . ondansetron (ZOFRAN) 8 mg, 3 times daily PRN   . pentoxifylline (TRENTAL) 400 mg, oral, Daily - Transplant   . polyethylene glycol (GLYCOLAX) 17 g, oral, Daily PRN   . pregabalin (LYRICA) 100 mg, oral, 3 times daily   . Rybelsus  3 mg, Daily   . spironolactone (ALDACTONE) 100 mg, 2 times daily   . sulfamethoxazole-trimethoprim (BACTRIM DS)  800-160 mg per tablet 1 tablet, oral, Daily   . tolnaftate  (TINACTIN) 1 % cream Apply  topically.   . triamcinolone  (KENALOG ) 0.1 % cream 1 Application, 2 times daily   . Vitamin B-12 1,000 mcg, Daily   . vitamin E 400 Units, oral, 2 times daily     PHYSICAL EXAMINATION:  VITALS: BP 131/61 (BP Location: Left arm, Patient Position: Sitting)   Pulse 74   Temp 96.9 F (36.1 C) (Temporal)   Resp 16   Ht 1.803 m (5' 10.98")   Wt 105 kg (232 lb 3.2 oz)   SpO2 97%   BMI 32.40 kg/m    GENERAL: Sitting on exam table in no acute distress.  SKIN: Skin warm and dry with good turgor.   HEENT: Normalocephalic, atraumatic. Sclera white, conjunctivae pink. PERRLA (pupils equal, round, reactive to light and near accommodation). EOMs intact. External nose symmetric and without lesions.  Lips without lesions. Small, non-drainage fistula noted of left lateral lower cheek. Oral mucosa pink and moist without lesions. Dentition fair.  CARDIOVASCULAR: Chest wall without deformity. No heaves, lifts, or thrills. Heart regular rate and rhythm. No murmurs, gallops, or rubs. Normal S1 and S2. No S3 or S4.  RESPIRATORY: Thorax symmetric with good expansion. Clear to auscultation bilaterally; no rales, rhonchi, or wheezes.   PV: Upper and lower extremities pink and warm to touch, non-tender to palpation. No cyanosis appreciated. 2+ pitting edema of BLEs, tapering to trace pitting edema by level of knees.  NEURO: Mental Status: Alert, relaxed, and cooperative. Thought process coherent. Oriented to person, place, and time. Detailed cognitive testing deferred. No focal weakness.  MSK: Full range of motion in all joints of the bilateral upper and lower extremities. No evidence of swelling or deformity. No tenderness to palpation of cervical, thoracic, or lumbar spine.    LABORATORY STUDIES:  Lab on 03/23/2024   Component Date Value Ref Range Status   . Sodium 03/23/2024 133 (L)  136 - 145 mmol/L Final   . Potassium 03/23/2024 4.4  3.4 - 4.5  mmol/L Final    NO VISIBLE HEMOLYSIS   . Chloride 03/23/2024 104  98 - 107 mmol/L Final   . CO2 03/23/2024 24  21 - 31 mmol/L Final   . Anion Gap 03/23/2024 5 (L)  6 - 14 mmol/L Final   . Glucose, Random 03/23/2024 180 (  H)  70 - 99 mg/dL Final   . Blood Urea Nitrogen (BUN) 03/23/2024 32 (H)  7 - 25 mg/dL Final   . Creatinine 16/09/9603 1.37 (H)  0.70 - 1.30 mg/dL Final   . eGFR 54/08/8118 58 (L)  >59 mL/min/1.40m2 Final    GFR estimated by CKD-EPI equations(NKF 2021).     "Recommend confirmation of Cr-based eGFR by using Cys-based eGFR and other filtration markers (if applicable) in complex cases and clinical decision-making, as needed."   . Albumin 03/23/2024 3.5  3.5 - 5.7 g/dL Final   . Total Protein 03/23/2024 6.2 (L)  6.4 - 8.9 g/dL Final   . Bilirubin, Total 03/23/2024 0.3  0.3 - 1.0 mg/dL Final   . Alkaline Phosphatase (ALP) 03/23/2024 114 (H)  34 - 104 U/L Final   . Aspartate Aminotransferase (AST) 03/23/2024 20  13 - 39 U/L Final   . Alanine Aminotransferase (ALT) 03/23/2024 23  7 - 52 U/L Final   . Calcium 03/23/2024 8.9  8.6 - 10.3 mg/dL Final   . BUN/Creatinine Ratio 03/23/2024 23.4 (H)  10.0 - 20.0 Final    Potential prerenal damage (e.g. CHF, GI Bleeding)   . WBC 03/23/2024 6.40  4.40 - 11.00 10*3/uL Final   . RBC 03/23/2024 3.63 (L)  4.50 - 5.90 10*6/uL Final   . Hemoglobin 03/23/2024 12.5 (L)  14.0 - 17.5 g/dL Final   . Hematocrit 14/78/2956 36.9 (L)  41.5 - 50.4 % Final   . Mean Corpuscular Volume (MCV) 03/23/2024 101.7 (H)  80.0 - 96.0 fL Final   . Mean Corpuscular Hemoglobin (MCH) 03/23/2024 34.4 (H)  27.5 - 33.2 pg Final   . Mean Corpuscular Hemoglobin Conc (* 03/23/2024 33.9  33.0 - 37.0 g/dL Final   . Red Cell Distribution Width (RDW) 03/23/2024 14.5  12.3 - 17.0 % Final   . Platelet Count (PLT) 03/23/2024 176  150 - 450 10*3/uL Final   . Mean Platelet Volume (MPV) 03/23/2024 8.3  6.8 - 10.2 fL Final   . Neutrophils % 03/23/2024 72  % Final   . Lymphocytes % 03/23/2024 17  % Final   . Monocytes  % 03/23/2024 9  % Final   . Eosinophils % 03/23/2024 1  % Final   . Basophils % 03/23/2024 1  % Final   . nRBC % 03/23/2024 0  % Final   . Neutrophils Absolute 03/23/2024 4.60  1.80 - 7.80 10*3/uL Final   . Lymphocytes # 03/23/2024 1.10  1.00 - 4.80 10*3/uL Final   . Monocytes # 03/23/2024 0.60  0.00 - 0.80 10*3/uL Final   . Eosinophils # 03/23/2024 0.10  0.00 - 0.50 10*3/uL Final   . Basophils # 03/23/2024 0.10  0.00 - 0.20 10*3/uL Final   . nRBC Absolute 03/23/2024 0.00  <=0.00 10*3/uL Final     IMAGING RESULTS:   Osseous survey  (09/05/2014):  Scattered lucencies in the skull may reflect venous lakes, though difficult to exclude myeloma. No osseous lesions, otherwise, in the appendicular or axial skeleton.    PETCT Scan (skull base to mid-thigh, 01/06/2020): No significant abnormal radiotracer accumulation in neck, chest, abd, or pelvis. No significant dominant or expansile lytic skeletal lesion is seen.    PATHOLOGY RESULTS:  Bone marrow biopsy (12/29/2019, [id number]): Normocellular marrow with approximately 5-10% plasma cells. CG with normal male karyotype. FISH myeloma panel negative. MRD negative (0.0000%) by flow to [facility].    ASSESMENT and PLAN:     Free lambda light chain multiple myeloma:  Bone marrow biopsy performed 12/2019 consistent with complete remission, MRD negative (0.0000%). PET/CT performed at OSH in 01/2020 with no evidence of active myeloma.  Continues to follow with local oncologist [facility]) and alternates visits here; seen at [facility] and seen at [facility].  Myeloma serology last performed 09/23/23 consistent with continued remission with repeat labs pending today (04/22).   Continue surveillance at this time.  He will return to clinic in 6 month(s) and was encouraged to contact our clinic in the interim if he develops any questions or concerns.     Macrocytic anemia:  Hgb stable in 12-13 g/dL range.  In light of ongoing macrocytosis, rechecking B12 and folate levels today (04/22) which were last  checked in 12/2019.  Continue to monitor.    Chronic kidney disease:  Stable today, consistent with his recent baseline (1.2 - 1.6 mg/dL).  Continue to monitor.    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  Managed by Pain Medicine, most recently underwent L4-L5 and L5-S1 RFA of medial branch and primary dorsal ramus on 02/25/24.  Reports ongoing LLE radiculopathy, as well as intermittent weakness of BLEs. Plans to discuss symptoms with Pain Medicine this afternoon; encouraged trial of Tylenol  500-625 mg BID-TID PRN.    Right shoulder pain and right hand weakness, recent fall:  Fell at home a week ago without LOC, did not seek evaluation after fall.  Reports full range of motion of right shoulder but experiences weakness in right hand when attempting to lift objects; reports this is largely improved compared to 1 week ago but is still problematic.  Recommended continued monitoring of symptoms and encouraged Matt to contact us  if hand weakness does not improve/resolve; if that occurs, then would pursue MRI right shoulder and referral to Orthopedics.    Supportive care:  Bisphosphonate therapy:  Contraindicated due to ONJ (last dose of Aredia in 2010).  Follows with ENT/oral surgery annually; uses chlorhexidine rinses and periodic courses of antibiotics PRN.  IVIG:  Not currently indicated.     FOLLOW-UP:  RTC 6 months - labs, visit

## 2024-03-23 NOTE — Progress Notes (Signed)
 [facility]   [facility]     Established Patient Note     Referring Doctor: No ref. provider found  Primary Doctor: [name], DO     Chief Complaint:  Chief Complaint   Patient presents with   . Back Pain     Lower    . Leg Pain     Bilateral      History of Present Illness: Connor Patterson is a 63 y.o. old male accompanied by his wife and being seen today by [name], DNP for return patient office visit.     Interval History: The patient was last seen on 02/05/2024 and 02/25/2024 for bilateral lumbar medial branch block RFA procedures by Dr. [name].  He does endorse a mechanical fall since last visit, but denies any acute injuries outside of bruising to his right leg and right arm.     Current Pain History:  Pain Description:  Since the last visit the pain is improved.  The most significant location of pain is the lower back.  Other areas of pain include the bilateral leg.    The 1-2 words that best describe the pain: sharp and throbbing  The pain improves with prescription pain medications.  The pain is made worse with most movements and general daily activities.  The average daily pain score is 4/10. The pain can be as high as 8/10 at its worst.  The pain interferes with: All activities.     Therapies:  Did the patient have an injection/procedure since the last visit? Yes              - Percentage of pain relief from injection/procedure: 80% or more) associated with improvements in physical functioning.     Has the patient had any new imaging (X-ray, MRI, CT) for pain concerns since the last visit? No     Has physical therapy been initiated or completed for this pain concern? No  - Where was physical therapy completed? Not applicable  Has a home exercise program (directed by a physical therapist or medical provider) been completed for this pain concern? No     Medications:  Was a new medication for pain started by our clinic at the last visit?  No              - Is the new medication useful for pain? No              - Has the new medication caused any significant side effects? No     Is the patient on a blood thinner (including aspirin, Goody/BC/Bayer powder)? No              - Name of anticoagulant/blood thinner: Not applicable     Review of Systems:  A complete ROS was performed with positive/negative pertinent findings listed in HPI.   ____________________________________________________________________  Historical Information:  Past Medical History:   Diagnosis Date   . Arthritis    . Asthma (CMD)    . DM w/o complication type II    (CMD) 01/21/2020   . Essential hypertension    . Multiple myeloma    (CMD)    . Renal artery stenosis    . Thyroid  disease      Past Surgical History:   Procedure Laterality Date   . ABDOMINAL SURGERY       Procedure: ABDOMINAL SURGERY; Renal Artery Stenosis (blockage in kidney)   . BACK  SURGERY  2011    Procedure: BACK SURGERY; L4-5    . HERNIA REPAIR      Procedure: HERNIA REPAIR; Umbilical Hernia Repair w/ Mesh   . LUMBAR LAMINECTOMY Bilateral 01/27/2020    Procedure: LUMBAR LAMINECTOMY MULTI LEVEL  L2-5, Lumbar reexploration L3-L4, Decompression L2-L3, L4-L5;  Surgeon: Mayra Specter Gust Leghorn., MD;  Location: [facility] MAIN OR;  Service: Neurosurgery;  Laterality: Bilateral;   . OTHER SURGICAL HISTORY      Procedure: OTHER SURGICAL HISTORY (PERIPHERAL  STEM CELL TRANSPLANT)   . ROOT CANAL      Procedure: ROOT CANAL     Family History   Problem Relation Name Age of Onset   . Autoimmune disease Mother     . Lymphoma Father     . Cancer Father     . Anesthesia problems Neg Hx       Social History     Tobacco Use   . Smoking status: Never   . Smokeless tobacco: Never   Substance Use Topics   . Alcohol use: Yes     No Known Allergies  Current Outpatient Medications on File Prior to Visit   Medication Sig Dispense Refill   . albuterol 2.5 mg /3 mL (0.083 %) nebulizer solution Take 1 ampule by nebulization every 6 (six) hours as needed  for wheezing.     Aaron Aas ascorbic acid (VITAMIN C) 500 mg tablet Take 500 mg by mouth Once Daily.     Aaron Aas aspirin (ECOTRIN) 325 mg EC tablet Take 325 mg by mouth Once Daily.     . baclofen  (LIORESAL ) 10 mg tablet Take 10 mg by mouth 3 (three) times a day. Pt takes at night     . cyanocobalamin (Vitamin B-12) 1,000 mcg tablet Take 1,000 mcg by mouth Once Daily.     . cyclobenzaprine  (FLEXERIL ) 5 mg tablet Take 5 mg by mouth 3 (three) times a day as needed for muscle spasms.     . fluticasone furoate-vilanteroL (Breo Ellipta ) 100-25 mcg/dose dsdv inhaler Inhale 1 puff daily as needed.     . furosemide  (LASIX ) 20 mg tablet Take 20 mg by mouth daily as needed.     Aaron Aas ibuprofen (MOTRIN) 200 mg tablet Take 200 mg by mouth every 6 (six) hours as needed for mild pain (1-3).     Aaron Aas ipratropium (ATROVENT ) 42 mcg (0.06 %) spry nasal spray Administer 2 sprays into affected nostril(s).     Aaron Aas ketoconazole (NIZORAL) 2 % cream      . L.acid-L.casei-B.bif-B.lon-FOS (Probiotic Blend) 2 billion cell-50 mg cap Take 1 capsule by mouth daily as needed.     . levothyroxine  (SYNTHROID) 100 mcg tablet Take 100 mcg by mouth nightly.     . magnesium oxide 400 mg (241 mg magnesium) tab Take 400 mg by mouth Once Daily.     . mometasone (Nasonex) 50 mcg/actuation spry spray Administer 1 spray into each nostril daily as needed.     . ondansetron (ZOFRAN) 8 mg tablet Take 8 mg by mouth 3 (three) times a day as needed for nausea.  3   . pentoxifylline (TRENtal) 400 mg CR tablet Take 1 tablet (400 mg total) by mouth Once Daily. 90 tablet 3   . polyethylene glycol (GLYCOLAX) 17 gram packet Take 17 g by mouth daily as needed.     . pregabalin (LYRICA) 100 mg capsule TAKE 1 CAPSULE BY MOUTH THREE TIMES DAILY 270 capsule 0   . semaglutide  (Rybelsus ) 3 mg tab tablet  Take 3 mg by mouth daily.     Aaron Aas spironolactone (ALDACTONE) 100 mg tablet 100 mg 2 (two) times a day.     . sulfamethoxazole-trimethoprim (BACTRIM DS) 800-160 mg per tablet Take 1 tablet by mouth  daily. 91 tablet 3   . triamcinolone  (KENALOG ) 0.1 % cream Apply 1 Application topically 2 (two) times a day.     . vitamin E 180 mg (400 unit) capsule Take 400 Units by mouth 2 (two) times a day. 60 capsule 5   . tolnaftate  (TINACTIN) 1 % cream Apply  topically. (Patient not taking: Reported on 03/23/2024)     . [DISCONTINUED] acyclovir (ZOVIRAX) 400 mg tablet TAKE ONE TABLET BY MOUTH TWICE DAILY (Patient not taking: Reported on 03/23/2024) 60 tablet 5   . [DISCONTINUED] allopurinoL  (ZYLOPRIM ) 300 mg tablet Take 300 mg by mouth Once Daily. (Patient not taking: Reported on 03/23/2024)     . [DISCONTINUED] amoxicillin  (AMOXIL ) 500 mg capsule  (Patient not taking: Reported on 03/23/2024)     . [DISCONTINUED] chlorthalidone (HYGROTON) 25 mg tablet Take 25 mg by mouth daily.     . [DISCONTINUED] famotidine (PEPCID) 20 mg tablet Take 20 mg by mouth Once Daily. (Patient not taking: Reported on 03/23/2024)       No current facility-administered medications on file prior to visit.       Review of Systems:   ROS completed on pain intake sheet reviewed, pertinent positive/negative findings related to this visit are noted in the HPI and Assessment/Plan.  ____________________________________________________________________  Vitals:BP (!) 139/93 (BP Location: Left arm, Patient Position: Sitting)   Pulse 70   Temp 97.1 F (36.2 C) (Temporal)   Resp 16   Wt 105 kg (232 lb)   SpO2 94%   BMI 32.37 kg/m      Physical Exam:  Constitutional: Well developed, Well nourished, No acute distress and Interactive  General: Patient is alert and orientated  Psychological: The patient's mood is normal, and appropriate for the circumstance        Pain (Neuro/Musculoskeletal) Physical Exam:   Spine:                         The spine is mildly curved.   Gait:                antalgic. Uses assist device - single point cane      Pain (Neuro/Musculoskeletal):  Back:              decreased range of motion, with paraspinal tenderness, with  significant pain with facet loading B/l (spine extension and rotation), without Q/L muscle tenderness, straight leg positive on the left     Sensory:   Fine sensation:           Normal in LE, Bilaterally symmetric     Motor:  Muscle Left Right   EHL (L5) 5/5 5/5   TA (L4) 5/5 5/5   GS (S1) 5/5 5/5   Quad (L3) 5/5 5/5   HS (L2) 5/5 5/5    ____________________________________________________________________  Controlled Substance Monitoring:n/a  ____________________________________________________________________  Imaging and Diagnostic Studies:   All applicable diagnostic studies related to this consultation have been reviewed.  Relevant diagnostic reports and/or my personal review of imaging or other diagnostic studies listed below:     ____________________________________________________________________  Relevant Labs Reviewed:                Lab Results   Component Value Date  CREATININE 1.21 02/11/2023                   Lab Results   Component Value Date     WBC 8.80 02/11/2023     HGB 14.0 02/11/2023     HCT 41.0 (L) 02/11/2023     MCV 101.3 (H) 02/11/2023     PLT 211 02/11/2023                   Lab Results   Component Value Date     HGBA1C 9.7 (H) 01/21/2020      ____________________________________________________________________     Diagnosis:  Assessment and Plan:  Lamel Hayenga is a 63 y.o. old male with hx multiple myeloma s/p bone marrow transplant. Hx lumbar laminectomy L2-L5 (01/2020). presenting today for postprocedure follow-up of multifactorial low back pain consistent with:   1. Lumbar spondylosis           Medications:  Reports experiencing breakthrough pain with Lyrica 100 mg TID.  Will send with a new prescription for 75 mg 4 times daily.      Discussed medication risks, benefits, alternatives, and side effects of medication at length. Educational sheets provided and reviewed. All of patient's questions and concerns addressed.     Procedural Interventions:  Excellent relief with  lumbar RFA procedures.  Consider repeat in the future should this pain worsen or recur.  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   Decision regarding the above listed minor surgery/procedures were discussed with the patient.    Risk factors of the above listed minor surgery/procedures were discussed with the patient. In addition, educational sheets were given or sent via [facility], to the patient, of the above procedures/minor surgery which includes risks factors: such as, but not limited to bleeding, infection, nerve damage, damage to adjacent structures.     Physical Therapy:  Completed PT and continues to perform HEP.  He is encouraged to engage in low impact exercise regularly.     Imaging/Diagnostic Studies:  none     Referrals/Consults:  [facility] with [facility] and wants to avoid surgery if at all possible.      Follow up:  Return if symptoms worsen or fail to improve.                   Treatment plan fully discussed and agreed upon with the patient. All questions were answered.

## 2024-03-23 NOTE — Telephone Encounter (Signed)
 [facility]   Pain and Spine Specialists   Established Patient Screening Note    Connor Patterson is a 63 y.o. old male presenting for return patient visit.    Pain Description:  Since the last visit the pain is improved.  The most significant location of pain is the lower back.  Other areas of pain include the bilateral leg.    The 1-2 words that best describe the pain: sharp and throbbing  The pain improves with prescription pain medications.  The pain is made worse with most movements and general daily activities.  The average daily pain score is 4/10. The pain can be as high as 8/10 at its worst.  The pain interferes with: All activities.    Therapies:  Did the patient have an injection/procedure since the last visit? Yes   - Percentage of pain relief from injection/procedure: 80% or more    Has the patient had any new imaging (X-ray, MRI, CT) for pain concerns since the last visit? No    Has physical therapy been initiated or completed for this pain concern? No  - Where was physical therapy completed? Not applicable  Has a home exercise program (directed by a physical therapist or medical provider) been completed for this pain concern? No    Medications:  Was a new medication for pain started by our clinic at the last visit? No   - Is the new medication useful for pain? No   - Has the new medication caused any significant side effects? No    Is the patient on a blood thinner (including aspirin, Goody/BC/Bayer powder)? No   - Name of anticoagulant/blood thinner: Not applicable

## 2024-03-24 ENCOUNTER — Other Ambulatory Visit (INDEPENDENT_AMBULATORY_CARE_PROVIDER_SITE_OTHER): Payer: Self-pay | Admitting: INTERNAL MEDICINE

## 2024-03-24 MED ORDER — FUROSEMIDE 20 MG TABLET
20.0000 mg | ORAL_TABLET | Freq: Every day | ORAL | 3 refills | Status: DC
Start: 2024-03-24 — End: 2024-10-11

## 2024-04-08 ENCOUNTER — Ambulatory Visit: Attending: INTERNAL MEDICINE | Admitting: INTERNAL MEDICINE

## 2024-04-08 ENCOUNTER — Encounter (INDEPENDENT_AMBULATORY_CARE_PROVIDER_SITE_OTHER): Payer: Self-pay | Admitting: INTERNAL MEDICINE

## 2024-04-08 ENCOUNTER — Other Ambulatory Visit: Payer: Self-pay

## 2024-04-08 VITALS — BP 139/60 | HR 78 | Resp 18 | Ht 71.0 in | Wt 229.4 lb

## 2024-04-08 DIAGNOSIS — T458X5A Adverse effect of other primarily systemic and hematological agents, initial encounter: Secondary | ICD-10-CM | POA: Insufficient documentation

## 2024-04-08 DIAGNOSIS — G8929 Other chronic pain: Secondary | ICD-10-CM | POA: Insufficient documentation

## 2024-04-08 DIAGNOSIS — M5442 Lumbago with sciatica, left side: Secondary | ICD-10-CM | POA: Insufficient documentation

## 2024-04-08 DIAGNOSIS — M150 Primary generalized (osteo)arthritis: Secondary | ICD-10-CM | POA: Insufficient documentation

## 2024-04-08 DIAGNOSIS — I701 Atherosclerosis of renal artery: Secondary | ICD-10-CM | POA: Insufficient documentation

## 2024-04-08 DIAGNOSIS — M87180 Osteonecrosis due to drugs, jaw: Secondary | ICD-10-CM | POA: Insufficient documentation

## 2024-04-08 DIAGNOSIS — K219 Gastro-esophageal reflux disease without esophagitis: Secondary | ICD-10-CM | POA: Insufficient documentation

## 2024-04-08 DIAGNOSIS — M1A3790 Chronic gout due to renal impairment, unspecified ankle and foot, without tophus (tophi): Secondary | ICD-10-CM | POA: Insufficient documentation

## 2024-04-08 DIAGNOSIS — I5032 Chronic diastolic (congestive) heart failure: Secondary | ICD-10-CM | POA: Insufficient documentation

## 2024-04-08 DIAGNOSIS — Z789 Other specified health status: Secondary | ICD-10-CM | POA: Insufficient documentation

## 2024-04-08 DIAGNOSIS — C9001 Multiple myeloma in remission: Secondary | ICD-10-CM | POA: Insufficient documentation

## 2024-04-08 DIAGNOSIS — Z0000 Encounter for general adult medical examination without abnormal findings: Secondary | ICD-10-CM

## 2024-04-08 DIAGNOSIS — M5431 Sciatica, right side: Secondary | ICD-10-CM | POA: Insufficient documentation

## 2024-04-08 DIAGNOSIS — Z9484 Stem cells transplant status: Secondary | ICD-10-CM | POA: Insufficient documentation

## 2024-04-08 DIAGNOSIS — I10 Essential (primary) hypertension: Secondary | ICD-10-CM | POA: Insufficient documentation

## 2024-04-08 DIAGNOSIS — Z9481 Bone marrow transplant status: Secondary | ICD-10-CM | POA: Insufficient documentation

## 2024-04-08 DIAGNOSIS — R110 Nausea: Secondary | ICD-10-CM | POA: Insufficient documentation

## 2024-04-08 DIAGNOSIS — E039 Hypothyroidism, unspecified: Secondary | ICD-10-CM | POA: Insufficient documentation

## 2024-04-08 DIAGNOSIS — I739 Peripheral vascular disease, unspecified: Secondary | ICD-10-CM | POA: Insufficient documentation

## 2024-04-08 DIAGNOSIS — E782 Mixed hyperlipidemia: Secondary | ICD-10-CM | POA: Insufficient documentation

## 2024-04-08 DIAGNOSIS — E1122 Type 2 diabetes mellitus with diabetic chronic kidney disease: Secondary | ICD-10-CM | POA: Insufficient documentation

## 2024-04-08 DIAGNOSIS — M5432 Sciatica, left side: Secondary | ICD-10-CM | POA: Insufficient documentation

## 2024-04-08 DIAGNOSIS — N1831 Chronic kidney disease, stage 3a: Secondary | ICD-10-CM | POA: Insufficient documentation

## 2024-04-08 DIAGNOSIS — M5441 Lumbago with sciatica, right side: Secondary | ICD-10-CM | POA: Insufficient documentation

## 2024-04-08 DIAGNOSIS — E119 Type 2 diabetes mellitus without complications: Secondary | ICD-10-CM | POA: Insufficient documentation

## 2024-04-08 DIAGNOSIS — J4520 Mild intermittent asthma, uncomplicated: Secondary | ICD-10-CM | POA: Insufficient documentation

## 2024-04-08 LAB — LIPID PANEL
CHOL/HDL RATIO: 5.8
CHOLESTEROL: 233 mg/dL — ABNORMAL HIGH (ref <200)
HDL CHOL: 40 mg/dL (ref >=40)
LDL CALC: 159 mg/dL — ABNORMAL HIGH (ref 0–100)
TRIGLYCERIDES: 170 mg/dL — ABNORMAL HIGH (ref <=150)
VLDL CALC: 34 mg/dL (ref 0–50)

## 2024-04-08 LAB — SCAN DIFFERENTIAL: PLATELET MORPHOLOGY COMMENT: NORMAL

## 2024-04-08 LAB — CBC WITH DIFF
BASOPHIL #: 0 10*3/uL (ref 0.00–0.10)
BASOPHIL %: 0 % (ref 0–1)
EOSINOPHIL #: 0.1 10*3/uL (ref 0.00–0.50)
EOSINOPHIL %: 1 % (ref 1–8)
HCT: 38.8 % (ref 36.7–47.1)
HGB: 13.3 g/dL (ref 12.5–16.3)
LYMPHOCYTE #: 1.7 10*3/uL (ref 1.00–3.20)
LYMPHOCYTE %: 20 % (ref 15–43)
MCH: 34.6 pg — ABNORMAL HIGH (ref 23.8–33.4)
MCHC: 34.3 g/dL (ref 32.5–36.3)
MCV: 100.7 fL — ABNORMAL HIGH (ref 73.0–96.2)
MONOCYTE #: 0.8 10*3/uL (ref 0.30–1.10)
MONOCYTE %: 10 % (ref 6–14)
MPV: 9.3 fL (ref 7.4–11.4)
NEUTROPHIL #: 5.6 10*3/uL (ref 1.70–7.60)
NEUTROPHIL %: 68 % (ref 44–74)
PLATELETS: 204 10*3/uL (ref 140–440)
RBC: 3.86 10*6/uL — ABNORMAL LOW (ref 4.06–5.63)
RDW: 15.2 % (ref 12.1–16.2)
WBC: 8.2 10*3/uL (ref 3.6–10.2)

## 2024-04-08 LAB — COMPREHENSIVE METABOLIC PNL, FASTING
ALBUMIN/GLOBULIN RATIO: 1.3 (ref 0.8–1.4)
ALBUMIN: 3.9 g/dL (ref 3.5–5.7)
ALKALINE PHOSPHATASE: 83 U/L (ref 34–104)
ALT (SGPT): 19 U/L (ref 7–52)
ANION GAP: 6 mmol/L (ref 4–13)
AST (SGOT): 19 U/L (ref 13–39)
BILIRUBIN TOTAL: 0.3 mg/dL (ref 0.3–1.0)
BUN/CREA RATIO: 17 (ref 6–22)
BUN: 33 mg/dL — ABNORMAL HIGH (ref 7–25)
CALCIUM, CORRECTED: 9.7 mg/dL (ref 8.9–10.8)
CALCIUM: 9.6 mg/dL (ref 8.6–10.3)
CHLORIDE: 106 mmol/L (ref 98–107)
CO2 TOTAL: 23 mmol/L (ref 21–31)
CREATININE: 1.9 mg/dL — ABNORMAL HIGH (ref 0.60–1.30)
ESTIMATED GFR: 39 mL/min/{1.73_m2} — ABNORMAL LOW (ref >59)
GLOBULIN: 2.9 (ref 2.0–3.5)
GLUCOSE: 102 mg/dL (ref 74–109)
OSMOLALITY, CALCULATED: 278 mosm/kg (ref 270–290)
POTASSIUM: 5.1 mmol/L (ref 3.5–5.1)
PROTEIN TOTAL: 6.8 g/dL (ref 6.4–8.9)
SODIUM: 135 mmol/L — ABNORMAL LOW (ref 136–145)

## 2024-04-08 LAB — THYROID STIMULATING HORMONE (SENSITIVE TSH): TSH: 1.154 u[IU]/mL (ref 0.450–5.330)

## 2024-04-08 LAB — URIC ACID: URIC ACID: 4.2 mg/dL (ref 2.3–7.6)

## 2024-04-08 NOTE — Nursing Note (Signed)
 04/08/24 1200   Comprehensive Health Assessment-Adult   Do you wish to complete this form? Yes   During the past 4 weeks, how would you rate your health in general? Fair   During the past 4 weeks, how much difficulty have you had doing your usual activities inside and outside your home because of medical or emotional problems? Some difficulty   During the past 4 weeks, was someone available to help you if you needed and wanted help? Yes, as much as I wanted   In the past year, how many times have you gone to the emergency department or been admitted to a hospital for a health problem? None   Are you generally satisfied with your sleep? Yes   Do you have enough money to buy things you need in everyday life, such as food, clothing, medicines, and housing? Yes, always   Can you get to places beyond walking distance without help?  (For example, can you drive your own car or travel alone on buses)? Yes   Do you fasten your seatbelt when you are in a car? Yes, usually   Do you exercise 20 minutes 3 or more days per week (such as walking, dancing, biking, mowing grass, swimming)? Yes, most of the time   How often do you eat food that is healthy (fruits, vegetables, lean meats) instead of unhealthy (sweets, fast food, junk food, fatty foods)? Almost always   Have your parents, brothers or sisters had any of the following problems before the age of 76? (check all that apply) Diabetes (sugar);Heart problems, or hardening of the arteries;Cancer;High cholesterol;Mental health problems such as depression, bipolar, severe anxiety, postpartum depression   How often do you have trouble taking medicines the eay you are told to take them? I always take them as prescribed   Do you need any help communicating with your doctors and nurses because of vision or hearing problems? No   During the past 12 months, have you experienced confusion or memory loss that is happening more often or is getting worse? No   Do you have one person you  think of as your personal doctor (primary care provider or family doctor)? Yes   If you are seeing a Primary Care Provider (PCP) or family doctor. please list their name [place]   Are you now also seeing any specialist physician(s) (such as eye doctor, foot doctor, skin doctor)? Yes   If you are seeing a specialist for anything such as foot, eye, skin, etc.  please list their name(s) Jacquline Maw, [place], [place]   How confident are you that you can control or manage most of your health problems? Very confident

## 2024-04-08 NOTE — Patient Instructions (Signed)
 Medicare Preventive Services  Medicare coverage information Recommendation for YOU   Heart Disease and Diabetes   Lipid profile every 5 years or more often if at risk for cardiovascular disease  Lab Results   Component Value Date    CHOLESTEROL 214 (H) 12/16/2023    HDLCHOL 44 12/16/2023    LDLCHOL 158 (H) 12/16/2023    LDLCHOLDIR 174 (H) 12/16/2023    TRIG 60 12/16/2023       Diabetes Screening with Blood Glucose test or Glucose Tolerance Test Yearly for those at risk for diabetes, up to two tests per year for those with prediabetes  Last Glucose: 149     Diabetes Self-Management Training   Initial training ten hours per year, and follow-up training two hours per subsequent year. Optional for those with diabetes    Medical Nutrition Therapy   Three hours of one-on-one counseling in first year, two hours in subsequent years. Optional for those with diabetes, kidney disease   Intensive Behavioral Therapy for Obesity  Face-to-face counseling, first month every week, month 2-6 every other week, month 7-12 every month if continued progress is documented Optional for those with Body Mass Index 30 or higher  Your There is no height or weight on file to calculate BMI.   Tobacco Cessation (Quitting) Counseling   Two attempts per year, max 4 sessions per attempt, up to 8 sessions per year Optional for those who use tobacco    Cancer Screening Last Completion Date   Colorectal screening   For anyone age 63 to 26 or any age if high risk:  Screening Colonoscopy every 10 yrs if low risk,  more frequent if higher risk  OR  Cologuard Stool DNA test once every 3 years OR  Fecal Occult Blood Testing yearly OR  Flexible  Sigmoidoscopy  every 5 yr OR  CT Colonography every 5 yrs    See below for due date if applicable.   Prostate Cancer Screening  Prostate Specific Antigen blood test based on joint decision making with your provider for ages 59-69  A joint decision between you and your primary care provider   Lung Cancer  Screening  Annual low dose computed tomography (LDCT scan) is recommended for those age 63-80 who smoked 20 pack-years and are current smokers or quit smoking within past 15 years, after counseling by your doctor or nurse clinician about the possible benefits or harms.   See below for due date if applicable.   Vaccinations   Respiratory syncytial virus (RSV)  Age 63 years or older: Based on shared clinical decision-making with your provider.  Pneumococcal Vaccine  Recommended routinely age 4+ with one or two separate vaccines based on your risk. Recommended before age 45 if medical conditions with increased risk  Seasonal Influenza Vaccine  Once every flu season   Hepatitis B Vaccine  3 doses if risk (including anyone with diabetes or liver disease)  Shingles Vaccine  Two doses at age 13 or older  Diphtheria Tetanus Pertussis Vaccine  ONCE as adult, booster every 10 years     Immunization History   Administered Date(s) Administered    AFLURIA-Influenza Vaccine (3 Yr+)(Admin) 01/06/2006, 08/09/2008, 08/29/2014, 09/11/2015    Covid-19 Vaccine,Pfizer-BioNTech,Purple Top,63yrs+ 02/18/2020, 03/10/2020, 11/29/2020    High-Dose Influenza Vaccine, 63+ 10/26/2019, 09/24/2022    Influenza Vaccine, 6 month-adult 09/15/2017, 09/16/2018, 08/26/2019, 09/08/2020, 10/16/2021, 09/18/2022, 12/15/2023     Shingles vaccine and Diphtheria Tetanus Pertussis vaccines are available at pharmacies or local health department without a prescription.  Other Preventative Screening  Last Completion Date   Glaucoma Screening   Yearly if in high risk group such as diabetes, family history, African American age 33+ or Hispanic American age 65+ See your Eye Care Provider   Hepatitis C Screening   Recommended  for those born between ages 18-79 years.   See below for due date if applicable.     HIV Testing  Recommended routinely at least ONCE, covered every year for age 63 to 9 regardless of risk, and every year for age over 18 who ask for the test  or higher risk. Yearly or up to 3 times in pregnancy    See below for due date if applicable.  Bone Densitometry   Screening is recommended for Men ages 19 and above with one or more risk factor: androgen deprivation therapy for prostate cancer, hypogonadism, frailty, primary hyperparathyroidism, hyperthyroidism  For men diagnosed with osteoporosis, follow up is recommended every years or a frequency recommended by your provider     See below for due date if applicable.   Abdominal Aortic Aneurysm Screening Ultrasound   Once with a family history of abdominal aortic aneurysms OR a male between 67-75 and have smoked at least 100 cigarettes in your lifetime.     See below for due date if applicable.       Your Personalized Schedule for Preventive Tests     Health Maintenance: Pending and Last Completed         Date Due Completion Date    HIV Screening Never done ---    Adult Tdap-Td (1 - Tdap) Never done ---    Shingles Vaccine (1 of 2) Never done ---    Pneumococcal Vaccination, Age 63+ (1 of 2 - PCV) Never done ---    Colonoscopy Never done ---    RSV Adult 63+ or Pregnancy (1 - Risk 60-74 years 1-dose series) Never done ---    Hepatitis C screening 05/04/2024 (Originally 1960/12/05) ---    Diabetic A1C 06/14/2024 12/16/2023    Diabetic Kidney Health Microalb/Cr Ratio 08/05/2024 08/06/2023    Depression Screening 12/14/2024 12/15/2023    Diabetic Kidney Health eGFR 01/07/2025 01/08/2024    Diabetic Retinal Exam 03/23/2025 03/24/2023    Medicare Annual Wellness Visit 04/08/2025 04/08/2024                For Information on Advanced Directives for Health Care:  [place]:  [email or web address]  [place], [place], [place], [place] General Information: [email or web address]

## 2024-04-08 NOTE — Progress Notes (Signed)
 [facility]  [address] [place], [place]  [address]  Phone: [phone] Fax: [phone]    Name: Connor Patterson                       Date of Birth: 1961-04-16   MRN:  [id number]                         Date of visit: 04/08/2024     Chief Complaint: Follow Up 3 Months (No new issues) and Medication Refill    Current Outpatient Medications   Medication Sig    allopurinoL  (ZYLOPRIM ) 300 mg Oral Tablet Take 1 Tablet (300 mg total) by mouth Once a day    ascorbic acid, vitamin C, (VITAMIN C) 500 mg Oral Tablet Take 1 Tablet (500 mg total) by mouth    aspirin (ECOTRIN) 325 mg Oral Tablet, Delayed Release (E.C.) Take 1 Tablet (325 mg total) by mouth    baclofen  (LIORESAL ) 10 mg Oral Tablet Take 1 Tablet (10 mg total) by mouth Three times a day    BREO ELLIPTA  100-25 mcg/dose Inhalation Disk with Device Take 1 Inhalation by inhalation Once a day    chlorhexidine gluconate (PERIDEX) 0.12 % Mucous Membrane Mouthwash 15 mL    cyanocobalamin (VITAMIN B 12) 1,000 mcg Oral Tablet Take 1 Tablet (1,000 mcg total) by mouth    famotidine (PEPCID) 20 mg Oral Tablet Take 1 Tablet (20 mg total) by mouth    furosemide  (LASIX ) 20 mg Oral Tablet Take 1 Tablet (20 mg total) by mouth Daily    ipratropium bromide  (ATROVENT ) 42 mcg (0.06 %) Nasal Spray, Non-Aerosol Administer 2 Sprays into affected nostril(s) Three times a day    levothyroxine  (SYNTHROID) 100 mcg Oral Tablet Take 1 tablet by mouth once daily    magnesium oxide (MAG-OX) 400 mg Oral Tablet Take 1 Tablet (400 mg total) by mouth    pentoxifylline (TRENTAL) 400 mg Oral Tablet Sustained Release Take 1 Tablet (400 mg total) by mouth    pregabalin (LYRICA) 75 mg Oral Capsule Take 1 Capsule (75 mg total) by mouth Four times a day    spironolactone (ALDACTONE) 100 mg Oral Tablet Take 1 Tablet (100 mg total) by mouth Twice daily    triamcinolone  acetonide 0.1 % Ointment APPLY0.5 G OF OINTMENT TOPICALLY TO AFFECTED AREA TWICE DAILY     trimethoprim-sulfamethoxazole (BACTRIM DS) 160-800mg  per tablet Take 1 Tablet (160 mg total) by mouth Daily    vitamin E 400 unit Oral Capsule Take 1 Capsule (400 Units total) by mouth       Patient Active Problem List    Diagnosis Date Noted    Type 2 diabetes mellitus with stage 3a chronic kidney disease, without long-term current use of insulin (CMS HCC) 04/08/2024    Chronic diastolic CHF (congestive heart failure) (CMS HCC) 04/08/2024    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    Benign hypertension 04/08/2022    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/08/2022    Type 2 diabetes mellitus without complication, without long-term current use of insulin 04/08/2022    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) 04/08/2022    Mixed hyperlipidemia 04/08/2022    Acquired hypothyroidism 04/08/2022    TIA (transient ischemic attack) 04/08/2022    Gout 04/08/2022  Chronic nausea 04/08/2022    Osteoarthritis 04/08/2022    Chronic low back pain 04/08/2022    Bilateral sciatica 04/08/2022    Mild intermittent asthma without complication 04/08/2022    Peripheral vascular disease (CMS HCC) 04/08/2022    Gastroesophageal reflux disease without esophagitis 04/08/2022    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    Had a mechanical fall. Jammed his right wrist and shoulder. Extensive bruising when it happened. Gradually improving.     Hypertension  W/ Hx of renal artery stenosis  Following with nephrology/cardiology  Has apt with cardiology soon    Diabetes mellitus type 2  Currently on glipizide   Eye exam Dr Maryagnes Small in March 2024. Everything was good per patient.   Did not tolerate DPP4.  Not a candidate for metformin. Did not tolerate SGLT2 2/2 side pain.  GLPs caused nausea.   Labs on 02-04-2019 showed hemoglobin A1c was 10.2.  Glipizide  was added at that time  Labs on 05-24-2019 showed hemoglobin A1c 9.9  Patient resistant to starting insulin  09-2019 working on diet. has lost weight.  Labs on 09-06-2019 showed hemoglobin A1c 12.0, micro albumin creatnine ratio <30   * Suggested start Insulin, but patient only will accept oral agents  Labs on 12-13-2019 showed hemoglobin A1c 12.6, microalbumin creatinine ratio less than 30  03-2020 Samples of Jardiance given to patient. Tolerarated so increased to max oral dose.  06-2020 D/C Jardiance due to side pain  Labs on 06-08-2020 showed hemoglobin A1c 8.9   * Started Rybelsus   09-2020 Log 85-191  Labs on 12-15-2020 showed hemoglobin A1c 6.9  Labs on 03-16-2021 showed hemoglobin A1c 7.3  Labs on 09-10-2021 showed hemoglobin A1c 6.8  Labs on 01-07-2022 showed hemoglobin A1c 8.0  Labs on 06/26/2022 showed hemoglobin A1c 7.0, microalbumin creatinine ratio less than 30  Labs on 10/31/2022 showed hemoglobin A1c 6.5  Rybelsus  stopped secondary to nausea  Labs on 01/31/2023 showed hemoglobin A1c 6.8  Labs on 05/05/2023 showed hemoglobin A1c 6.8  Labs on 08/26/2023 showed hemoglobin A1c 6.5, microalbumin creatinine ratio less than 30  12/16/2023 showed hemoglobin A1c 6.5    Multiple myeloma  Lambda light change, w/ renal disease  s/p autologous stem cell transplant, thalidomide and aredia chemotherapy  In Remission, was on Ninlaro maintenance  12-2020 Dr Herd has resumed care as they could not find anyone to cover. Patient will not need Ninlaro this year.  13-2440 Saw Dr Herd. He is definitely retiring. Will be seeing Dr Mccade.  09-2724 Saw Dr Cal Castle. No changes.  [id number] Saw Dr Cal Castle. No changes. Will see [facility] tomorrow.  01-2022 Bone Scan 1. Negative exam for fractures, lytic or blastic lesions in the skull, spine, ribs, pelvis, humeri and femora.  2. 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    Mixed hyperlipidemia  Does not tolerate statins except for Livalo.  Insurance will not cover Livalo  Have tried all available non-statin oral agents, and all of proven ineffective  With diabetes he does meet the criteria for the injectables, but Cardiology does not want him on them  Labs on 02-04-2019 showed total cholesterol 244, triglycerides 280, HDL 30, direct LDL 132  Labs on 05-24-2019 showed total cholesterol 273, triglycerides 701, HDL 25, direct LDL 117  Labs on 09-06-2019 showed total cholesterol 203, triglycerides  324, HDL 33, direct LDL 17  Labs on 12-13-2019 showed total cholesterol 212, triglycerides 217, HDL 32, direct LDL 144  Labs on 06-08-2020 showed total cholesterol 227, triglycerides 179, HDL 34, direct LDL 160  08-2020 Dr Daved Eriksson resume Zetia despite lack of efficacy in prior attempt  Labs on 12-15-2020 showed total cholesterol 179, triglycerides 125, HDL 38, direct LDL 114  Labs on 03-16-2021 showed total cholesterol 192, triglycerides 102, HDL 47, calculated LDL 125  Labs on 07-04-2021 showed total cholesterol 210, triglycerides 130, HDL 38, direct LDL 152  Labs on 01-07-2022 showed total cholesterol 230, triglycerides 206, HDL 37,   Labs on 06/26/2022 showed total cholesterol 228, triglycerides 271, HDL 34, direct LDL 170  Labs on 10/31/2022 showed total cholesterol 230, triglycerides 150, HDL 36, direct LDL 170  Labs on 01/31/2023 showed total cholesterol 186, triglycerides 117, HDL 37, direct LDL 143  Labs on 05/25/2023 showed total cholesterol 223, triglycerides 68, HDL 45, direct LDL 186  Labs on 08/06/2023 showed total cholesterol 219, triglycerides 94, HDL 46, direct LDL 176  12/16/2023 showed total cholesterol 214, triglycerides 60, HDL 44, direct LDL 174    Chronic diastolic CHF  Following with Dr Rayma Calandra    Hypothyroidism  On Synthroid 100 mcg  Labs on 02-04-2019 showed TSH 1.58 on Synthroid 100 mcg  Labs on 05-24-2019 showed TSH 3.83 on Synthroid 100 mcg  Labs on 09-06-2019  showed TSH 0.90 on Synthroid 100 mcg  Labs on 12-13-2019 showed TSH 1.28 on Synthroid 100 mcg  Labs on 06-08-2020 showed TSH 1.53 on Synthroid 100 mcg  Labs on 12-15-2020 showed TSH 3.46 on Synthroid 100 mcg  Labs on 03-16-2021 showed TSH 3.05 on Synthroid 100 mcg  Labs on 07-04-2021 showed TSH 3.05 on Synthroid 100 mcg  Labs on 01-07-2022 showed TSH 0.81 on Synthroid 100 mcg  Labs on 11/18/2022 showed TSH 0.97 on Synthroid 100 mcg  Labs on 01/31/2023 showed TSH 1.04 on Synthroid 100 mcg  Labs on 05/25/2023 showed TSH 1.70 on Synthroid 100 mcg  Labs on 08/26/2023 showed TSH 1.29 on Synthroid 100 mcg  12/16/2023 showed TSH 1.10 on Synthroid 100 mcg    TIA  Occurred 06-2020  On aspirin and Zetia    Gout  No flares since last visit  Labs on 09-06-2019 showed uric acid 5.8  Labs on 06-08-2020 showed uric acid 5.6  Labs on 12-15-2020 showed uric acid 5.8  Labs on 03-16-2021 showed uric acid 5.0  Labs on 07-04-2021 showed uric acid 4.7  Labs on 10/31/2022 showed uric acid 5.7  Labs on 01/31/2023 showed uric acid 3.9  12/16/2023 showed uric acid 3.9    Chronic nausea  On Zofran    Osteoarthritis/Low Back Pain/Sciatica  Known Disc Degeneration, had a spinal block about 6 years ago. Dr Aldon Ambrosia.  Previously on hydrocodone  from oncology for pain associated with multiple myeloma.  Has since been weaned off  Currently on Flexeril   Resumed course of physical therapy  09-2019 on codeine from Dr Chestine Costain  12-2019 Going to Dr Amanda Jungling in [facility] for consultation on 27th. He had an initial interview with NP last month. Suspects will have surgery  01-27-2020 Had surgery with Dr Amanda Jungling. Doing well. Is having physical therapy with Dr Isiah Mare.  16-1096 Knee pain has improved. Still having popping and cracking.  03-5408 Saw Neurosurgery. They are planning on referring for injections  04-2023 had injection  05-2023 Plans on repeating in a few months. Switched to Lyrica  07-2023 States he does  not want further surgery  04-2024 completed  spinal ablations. Improved everything except for sciatica. Was released on follow up.     Asthma  Following with pulmonology    Edema  On lasix     Peripheral vascular disease  Doing well with pentoxifylline    GERD  Doing well with Pepcid    Chronic kidney disease stage II-III  Labs on 09-06-2019 showed BUN 37, creatinine 1.5, GFR 51  Labs on 12-13-2019 showed BUN 23, creatinine 1.3, GFR greater than 60  Labs on 06-10-2020 showed BUN 20, creatinine 1.4, GFR 55  Labs on 12-15-2020 showed BUN 28, creatinine 1.3, GFR greater than 60  Labs on 03-16-2021 showed BUN 28, creatinine 1.3, GFR greater than 60  Labs on 09-10-2021 showed BUN 31, creatinine 1.5, GFR 51  Labs on 01-07-2022 showed BUN 24, creatinine 1.2, GFR greater than 60  Labs on 06/26/2022 showed BUN 32, creatinine 1.47, GFR 54  Labs on 10/31/2022 showed BUN 27, creatinine 1.26, GFR 65  Labs on 01/31/2023 showed BUN 22, creatinine 1.38, GFR 58  Labs on 08/26/2023 showed BUN 29, creatinine 1.59, GFR 49  12/16/2023 showed BUN 25, creatinine 1.27, GFR 64    ROS:  10 systems reviewed and were negative except as noted.   + edema  + nausea  + abnormal gait, back pain, joint pain, muscle cramps, muscle weakness, numbness, tingling    Objective :  BP 139/60 (Patient Position: Sitting)   Pulse 78   Resp 18   Ht 1.803 m (5\' 11" )   Wt 104 kg (229 lb 6.4 oz)   SpO2 96%   BMI 31.99 kg/m       General:  appears chronically ill and moderately obese  Lungs:  Clear to auscultation bilaterally.   Cardiovascular:  regular rate and rhythm  Neurologic:  Grossly normal  Psychiatric:  Normal    Data reviewed:  Heme/Onc note - no change  Spine note - radiofrequency thermal ablation    Assessment/Plan:  Assessment/Plan   1. Benign hypertension    2. Mixed hyperlipidemia    3. Type 2 diabetes mellitus with stage 3a chronic kidney disease, without long-term current use of insulin (CMS HCC)    4. Status post autologous bone marrow transplantation (CMS HCC)    5. Renal artery  stenosis (CMS HCC)    6. Peripheral vascular disease (CMS HCC)    7. Mild intermittent asthma without complication    8. 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 Gastroesophageal reflux disease without esophagitis    10. 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 Acquired hypothyroidism    12. Type 2 diabetes mellitus without complication, without long-term current use of insulin    13. Chronic nausea    14. Primary osteoarthritis involving multiple joints    15. Chronic bilateral low back pain with bilateral sciatica    16. 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 Bilateral sciatica    18. Chronic gout due to renal impairment involving foot without tophus, unspecified laterality    19. Stem cells transplant status (CMS HCC)    20. Statin intolerance    21. Chronic diastolic CHF (congestive heart failure) (CMS HCC)        Diabetes  Monitor  Continue glipizide   He does not like diabetic shoes. Actually caused more foot pain.     Hypertension  Continue diuretics  Follow with nephrology/cardiology    Multiple myeloma  In  remission  Follow with oncology    Mixed hyperlipidemia  Monitor    Hypothyroidism  Continue Synthroid    Nausea  Continue Zofran    Arthritis/back pain/sciatica  Continue Flexeril   Follow with Neurosurgery    Gout  Continue allopurinol     Asthma  Continue follow-up with pulmonology    Chronic edema  Continue diuretics    GERD  Continue Pepcid    Chronic disease stage III  Monitor    TIA  Follow with neurology    Restless leg  Monitor    Comprehensive Health Assessment:  Patient verbal responses recorded in flowsheet       04/08/2024    12:00 PM   Comprehensive Health Assessment-Adult   Do you wish to complete this form? Yes   During the past 4 weeks, how would you rate your health in general? Fair   During the past 4 weeks, how much difficulty have you had doing your usual activities inside and outside your home because of medical or emotional problems? Some  difficulty   During the past 4 weeks, was someone available to help you if you needed and wanted help? Yes, as much as I wanted   In the past year, how many times have you gone to the emergency department or been admitted to a hospital for a health problem? None   Are you generally satisfied with your sleep? Yes   Do you have enough money to buy things you need in everyday life, such as food, clothing, medicines, and housing? Yes, always   Can you get to places beyond walking distance without help?  (For example, can you drive your own car or travel alone on buses)? Yes   Do you fasten your seatbelt when you are in a car? Yes, usually   Do you exercise 20 minutes 3 or more days per week (such as walking, dancing, biking, mowing grass, swimming)? Yes, most of the time   How often do you eat food that is healthy (fruits, vegetables, lean meats) instead of unhealthy (sweets, fast food, junk food, fatty foods)? Almost always   Have your parents, brothers or sisters had any of the following problems before the age of 46? (check all that apply) Diabetes (sugar);Heart problems, or hardening of the arteries;Cancer;High cholesterol;Mental health problems such as depression, bipolar, severe anxiety, postpartum depression   How often do you have trouble taking medicines the eay you are told to take them? I always take them as prescribed   Do you need any help communicating with your doctors and nurses because of vision or hearing problems? No   During the past 12 months, have you experienced confusion or memory loss that is happening more often or is getting worse? No   Do you have one person you think of as your personal doctor (primary care provider or family doctor)? Yes   If you are seeing a Primary Care Provider (PCP) or family doctor. please list their name Andrw Mcguirt   Are you now also seeing any specialist physician(s) (such as eye doctor, foot doctor, skin doctor)? Yes   If you are seeing a specialist for anything  such as foot, eye, skin, etc.  please list their name(s) Jacquline Maw, [place], [place]   How confident are you that you can control or manage most of your health problems? Very confident       I have reviewed and updated as appropriate the past medical, family and social history. 04/08/2024 as summarized  below:  Past Medical History:   Diagnosis Date    Asthma     Diabetes mellitus, type 2     Essential hypertension     Hx of transfusion      Past Surgical History:   Procedure Laterality Date    Hx back surgery      Hx colonoscopy      Limbal stem cell transplant      Renal biopsy, open       Current Outpatient Medications   Medication Sig    allopurinoL  (ZYLOPRIM ) 300 mg Oral Tablet Take 1 Tablet (300 mg total) by mouth Once a day    ascorbic acid, vitamin C, (VITAMIN C) 500 mg Oral Tablet Take 1 Tablet (500 mg total) by mouth    aspirin (ECOTRIN) 325 mg Oral Tablet, Delayed Release (E.C.) Take 1 Tablet (325 mg total) by mouth    baclofen  (LIORESAL ) 10 mg Oral Tablet Take 1 Tablet (10 mg total) by mouth Three times a day    BREO ELLIPTA  100-25 mcg/dose Inhalation Disk with Device Take 1 Inhalation by inhalation Once a day    chlorhexidine gluconate (PERIDEX) 0.12 % Mucous Membrane Mouthwash 15 mL    cyanocobalamin (VITAMIN B 12) 1,000 mcg Oral Tablet Take 1 Tablet (1,000 mcg total) by mouth    famotidine (PEPCID) 20 mg Oral Tablet Take 1 Tablet (20 mg total) by mouth    furosemide  (LASIX ) 20 mg Oral Tablet Take 1 Tablet (20 mg total) by mouth Daily    ipratropium bromide  (ATROVENT ) 42 mcg (0.06 %) Nasal Spray, Non-Aerosol Administer 2 Sprays into affected nostril(s) Three times a day    levothyroxine  (SYNTHROID) 100 mcg Oral Tablet Take 1 tablet by mouth once daily    magnesium oxide (MAG-OX) 400 mg Oral Tablet Take 1 Tablet (400 mg total) by mouth    pentoxifylline (TRENTAL) 400 mg Oral Tablet Sustained Release Take 1 Tablet (400 mg total) by mouth    pregabalin (LYRICA) 75 mg Oral Capsule  Take 1 Capsule (75 mg total) by mouth Four times a day    spironolactone (ALDACTONE) 100 mg Oral Tablet Take 1 Tablet (100 mg total) by mouth Twice daily    triamcinolone  acetonide 0.1 % Ointment APPLY0.5 G OF OINTMENT TOPICALLY TO AFFECTED AREA TWICE DAILY    trimethoprim-sulfamethoxazole (BACTRIM DS) 160-800mg  per tablet Take 1 Tablet (160 mg total) by mouth Daily    vitamin E 400 unit Oral Capsule Take 1 Capsule (400 Units total) by mouth     Family Medical History:       Problem Relation (Age of Onset)    Autoimmune disease Mother    Carotid Stenosis Mother    Hodgkin's lymphoma Father            Social History     Socioeconomic History    Marital status: Married     Spouse name: Aurora Blowers    Number of children: 2   Tobacco Use    Smoking status: Never    Smokeless tobacco: Former   Haematologist status: Never Used   Substance and Sexual Activity    Alcohol use: Never    Drug use: Never     Social Determinants of Health     Transportation Needs: No Transportation Needs (03/23/2024)    Received from [employer]     In the past 12 months, has lack of reliable transportation kept you from medical appointments, meetings, work or from  getting things needed for daily living? : No   Housing Stability: Low Risk  (03/23/2024)    Received from [facility] Stability Vital Sign     What is your living situation today?: I have a steady place to live     Think about the place you live. Do you have problems with any of the following? Choose all that apply:: None/None on this list   Health Literacy: Low Risk  (08/06/2023)    Health Literacy     SDOH Health Literacy: Never         List of Current Health Care Providers   Care Team       PCP       Name Type Specialty Phone Number    [name], DO Physician INTERNAL MEDICINE [phone]              Care Team       Name Type Specialty Phone Number    Clinic), [facility] (Coumadin Not available [facility] [phone]    Merna Aase, MD Physician CARDIOVASCULAR DISEASE [phone]    Marzette Solders, FNP-BC Nurse Practitioner FAMILY NURSE PRACTITIONER [phone]                      Health Maintenance   Topic Date Due    HIV Screening  Never done    Adult Tdap-Td (1 - Tdap) Never done    Shingles Vaccine (1 of 2) Never done    Pneumococcal Vaccination, Age 27+ (1 of 2 - PCV) Never done    Colonoscopy  Never done    RSV Adult 60+ or Pregnancy (1 - Risk 60-74 years 1-dose series) Never done    Hepatitis C screening  05/04/2024 (Originally [birth year])    Diabetic A1C  06/14/2024    Diabetic Kidney Health Microalb/Cr Ratio  08/05/2024    Diabetic Kidney Health eGFR  01/07/2025    Diabetic Retinal Exam  03/23/2025    Depression Screening  04/08/2025    Medicare Annual Wellness Visit  04/08/2025    Influenza Vaccine  Completed    Covid-19 Vaccine  Discontinued     Medicare Wellness Assessment   Medicare initial or wellness physical in the last year?: No  Advance Directives   Does patient have a living will or MPOA: Yes   Has patient provided [employer] with a copy?: Yes              Activities of Daily Living   Do you need help with dressing, bathing, or walking?: No   Do you need help with shopping, housekeeping, medications, or finances?: Yes   Do you have rugs in hallways, broken steps, or poor lighting?: No   Do you have grab bars in your bathroom, non-slip strips in your tub, and hand rails on your stairs?: Yes   Cognitive Function Screen (1=Yes, 0=No)   What is you age?: Correct   What is the time to the nearest hour?: Correct   What is the year?: Correct   What is the name of this clinic?: Correct   Can the patient recognize two persons (the doctor, the nurse, home help, etc.)?: Correct   What is the date of your birth? (day and month sufficient) : Correct   In what year did World War II end?: Correct   Who is the current president of the [place] ?: Correct   Count from 20 down to  1?:  Correct   What address did I give you earlier?: Incorrect   Total Score: 9   Interpretation of Total Score: Greater than 6 Normal   Fall Risk Screen   Do you feel unsteady when standing or walking?: Yes  Do you worry about falling?: No  Have you fallen in the past year?: Yes  How many times have you fallen?: Once  Were you ever injured from falling?: Yes   Depression Screen     Little interest or pleasure in doing things.: Not at all  Feeling down, depressed, or hopeless: Not at all  PHQ 2 Total: 0     Pain Score   Pain Score:   3    Substance Use-Abuse Screening     Tobacco Use     In Past 12 MONTHS, how often have you used any tobacco product (for example, cigarettes, e-cigarettes, cigars, pipes, or smokeless tobacco)?: Never     Alcohol use     In the PAST 12 MONTHS, how often have you had 5 (men)/4 (women) or more drinks containing alcohol in one day?: Never     Prescription Drug Use     In the PAST 12 months, how often have you used any prescription medications just for the feeling, more than prescribed, or that were not prescribed for you? Prescriptions may include: opioids, benzodiazepines, medications for ADHD: Never           Illicit Drug Use   In the PAST 12 MONTHS, how often have you used any drugs, including marijuana, cocaine or crack, heroin, methamphetamine, hallucinogens, ecstasy/MDMA?: Never        Hearing Screen   Have you noticed any hearing difficulties?: No  After whispering 9-1-6 how many numbers did the patient repeat correctly?: 3       Vision Screen                    OBJECTIVE:   BP 139/60 (Patient Position: Sitting)   Pulse 78   Resp 18   Ht 1.803 m (5\' 11" )   Wt 104 kg (229 lb 6.4 oz)   SpO2 96%   BMI 31.99 kg/m        Other appropriate exam:    Health Maintenance Due   Topic Date Due    HIV Screening  Never done    Adult Tdap-Td (1 - Tdap) Never done    Shingles Vaccine (1 of 2) Never done    Pneumococcal Vaccination, Age 39+ (1 of 2 - PCV) Never done    Colonoscopy  Never done     RSV Adult 60+ or Pregnancy (1 - Risk 60-74 years 1-dose series) Never done      ASSESSMENT & PLAN:   Assessment/Plan   1. Benign hypertension    2. Mixed hyperlipidemia    3. Type 2 diabetes mellitus with stage 3a chronic kidney disease, without long-term current use of insulin (CMS HCC)    4. Status post autologous bone marrow transplantation (CMS HCC)    5. Renal artery stenosis (CMS HCC)    6. Peripheral vascular disease (CMS HCC)    7. Mild intermittent asthma without complication    8. 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 Gastroesophageal reflux disease without esophagitis    10. 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 Acquired hypothyroidism    12. Type 2 diabetes mellitus without complication, without long-term current use of insulin  13. Chronic nausea    14. Primary osteoarthritis involving multiple joints    15. Chronic bilateral low back pain with bilateral sciatica    16. 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 Bilateral sciatica    18. Chronic gout due to renal impairment involving foot without tophus, unspecified laterality    19. Stem cells transplant status (CMS HCC)    20. Statin intolerance    21. Chronic diastolic CHF (congestive heart failure) (CMS HCC)       Identified Risk Factors/ Recommended Actions         Orders Placed This Encounter    CBC/DIFF    COMPREHENSIVE METABOLIC PNL, FASTING    HGA1C (HEMOGLOBIN A1C WITH EST AVG GLUCOSE)    LDL CHOLESTEROL, DIRECT    LIPID PANEL    URIC ACID    THYROID  STIMULATING HORMONE (SENSITIVE TSH)        The patient has been educated about risk factors and recommended preventive care. Written Prevention Plan completed/ updated and given to patient (see After Visit Summary).    Return in about 3 months (around 07/09/2024).    [name], DO, [facility]  Internal Medicine

## 2024-04-08 NOTE — Nursing Note (Signed)
 04/08/24 1100   Medicare Wellness Assessment   Medicare initial or wellness physical in the last year? No   Advance Directives   Does patient have a living will or MPOA Yes   Has patient provided [employer] with a copy? Yes   Activities of Daily Living   Do you need help with dressing, bathing, or walking? No   Do you need help with shopping, housekeeping, medications, or finances? Yes   Do you have rugs in hallways, broken steps, or poor lighting? No   Do you have grab bars in your bathroom, non-slip strips in your tub, and hand rails on your stairs? Yes   Cognitive Function Screen   What is you age? 1   What is the time to the nearest hour? 1   What is the year? 1   What is the name of this clinic? 1   Can the patient recognize two persons (the doctor, the nurse, home help, etc.)? 1   What is the date of your birth? (day and month sufficient)  1   In what year did World War II end? 1   Who is the current president of the [place] ? 1   Count from 20 down to 1? 1   What address did I give you earlier? 0   Total Score 9   Interpretation of Total Score Greater than 6 Normal   Depression Screen   Little interest or pleasure in doing things. 0   Feeling down, depressed, or hopeless 0   PHQ 2 Total 0   Pain Score   Pain Score Three   Substance Use Screening   In Past 12 MONTHS, how often have you used any tobacco product (for example, cigarettes, e-cigarettes, cigars, pipes, or smokeless tobacco)? Never   In the PAST 12 MONTHS, how often have you had 5 (men)/4 (women) or more drinks containing alcohol in one day? Never   In the PAST 12 months, how often have you used any prescription medications just for the feeling, more than prescribed, or that were not prescribed for you? Prescriptions may include: opioids, benzodiazepines, medications for ADHD Never   In the PAST 12 MONTHS, how often have you used any drugs, including marijuana, cocaine or crack, heroin, methamphetamine, hallucinogens, ecstasy/MDMA? Never    Hearing Screen   Have you noticed any hearing difficulties? No   After whispering 9-1-6 how many numbers did the patient repeat correctly? 3   Fall Risk Assessment   Do you feel unsteady when standing or walking? Yes   Do you worry about falling? No   Have you fallen in the past year? Yes   How many times have you fallen? Once   Were you ever injured from falling? Yes

## 2024-04-08 NOTE — Nursing Note (Signed)
 Patient is here for his follow up and Medicare wellness. Patient reports a recent fall in his driveway and hurt his shoulders and right leg.

## 2024-04-09 ENCOUNTER — Encounter (INDEPENDENT_AMBULATORY_CARE_PROVIDER_SITE_OTHER): Payer: Self-pay | Admitting: NURSE PRACTITIONER

## 2024-04-09 ENCOUNTER — Ambulatory Visit (INDEPENDENT_AMBULATORY_CARE_PROVIDER_SITE_OTHER): Payer: Self-pay | Admitting: INTERNAL MEDICINE

## 2024-04-09 ENCOUNTER — Ambulatory Visit (INDEPENDENT_AMBULATORY_CARE_PROVIDER_SITE_OTHER)
Admission: RE | Admit: 2024-04-09 | Discharge: 2024-04-09 | Disposition: A | Discharge status: Home / Self Care | Payer: Self-pay | Source: Ambulatory Visit | Attending: NURSE PRACTITIONER | Admitting: NURSE PRACTITIONER

## 2024-04-09 ENCOUNTER — Ambulatory Visit: Payer: Self-pay | Attending: NURSE PRACTITIONER | Admitting: NURSE PRACTITIONER

## 2024-04-09 VITALS — BP 166/78 | HR 78 | Temp 97.2°F | Ht 71.0 in | Wt 229.6 lb

## 2024-04-09 DIAGNOSIS — C9000 Multiple myeloma not having achieved remission: Secondary | ICD-10-CM | POA: Insufficient documentation

## 2024-04-09 LAB — HGA1C (HEMOGLOBIN A1C WITH EST AVG GLUCOSE): HEMOGLOBIN A1C: 6.6 % — ABNORMAL HIGH (ref 4.0–6.0)

## 2024-04-09 NOTE — Telephone Encounter (Signed)
 Pt was notified. dh

## 2024-04-09 NOTE — Cancer Center Note (Signed)
 Department of Hematology/Oncology  Progress Note   Name: Connor Patterson  KVQ:[id number]  Date of Birth: 1961-02-18  Encounter Date: 04/09/2024    REFERRING PROVIDER:  [name], DO  [address]  [place],  [place] [address]     REASON FOR OFFICE VISIT:  Multiple Myeloma     HISTORY OF PRESENT ILLNESS:  Connor Patterson is a 63 y.o. male who presents today for follow up of multiple myeloma.  He was originally diagnosed in January 2004, and at that time had renal failure and a respiratory infection his serum electrophoresis showed a monoclonal protein of 0.1 grams/deciliter.  His bone marrow biopsy at that time was consistent with the presence of multiple myeloma, and a skeletal survey showed numerous lytic lesions and he also had an elevated calcium level.     He was being seen at [facility] and had VAD chemotherapy for 4 cycles.  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  He was treated with thalidomide in a maintenance fashion.  He developed osteonecrosis of the jaw in 2013, and his bisphosphonate therapy was discontinued at that time.  He was apparently switch to Revlimid for maintenance treatment, and it was discontinued in 2017 due to poor tolerance.    Since 2018, the patient has been off of any maintenance therapy.  His monoclonal protein levels have been very low and he has been considered to still be in remission.  He is being followed with observation and continues to follow up at [facility].    01/08/2024: The patient is here for follow up of multiple myeloma.  He has been off of maintenance therapy and is having his labs rechecked every 6 months.  He states that he is doing well other than continued back pain. He is following up with a spine specialist at [facility] for this. He states that he is otherwise doing well and denies any other complaints/issues at this time.     04/09/2024: The patient is here today for follow up of multiple myeloma. He states that he followed up with Dr.  Charlott Converse yesterday and was told his triglycerides and A1C were elevated. He followed up with [facility] on 03/23/24 and was told everything was stable and he is to return for follow up in 6 months.     ROS:   Pertinent review of systems as discussed in HPI    HISTORY:  Past Medical History:   Diagnosis Date    Asthma     Diabetes mellitus, type 2     Essential hypertension     Hx of transfusion        Past Surgical History:   Procedure Laterality Date    HX BACK SURGERY      3 times    HX COLONOSCOPY      LIMBAL STEM CELL TRANSPLANT      RENAL BIOPSY, OPEN         Social History     Socioeconomic History    Marital status: Married     Spouse name: Aurora Blowers    Number of children: 2    Years of education: Not on file    Highest education level: Not on file   Occupational History    Not on file   Tobacco Use    Smoking status: Never    Smokeless tobacco: Former   Haematologist status: Never Used   Substance and Sexual Activity    Alcohol use: Never  Drug use: Never    Sexual activity: Not on file   Other Topics Concern    Not on file   Social History Narrative    Not on file     Social Determinants of Health     Financial Resource Strain: Not on file   Transportation Needs: No Transportation Needs (03/23/2024)    Received from [facility]    Transportation     In the past 12 months, has lack of reliable transportation kept you from medical appointments, meetings, work or from getting things needed for daily living? : No   Social Connections: Not on file   Intimate Partner Violence: Not on file   Housing Stability: Low Risk  (03/23/2024)    Received from [facility]    Housing Stability Vital Sign     What is your living situation today?: I have a steady place to live     Think about the place you live. Do you have problems with any of the following? Choose all that apply:: None/None on this list     Family Medical History:       Problem Relation (Age of Onset)    Autoimmune disease Mother    Carotid Stenosis Mother     Hodgkin's lymphoma Father            Current Outpatient Medications   Medication Sig    allopurinoL  (ZYLOPRIM ) 300 mg Oral Tablet Take 1 Tablet (300 mg total) by mouth Once a day    ascorbic acid, vitamin C, (VITAMIN C) 500 mg Oral Tablet Take 1 Tablet (500 mg total) by mouth    aspirin (ECOTRIN) 325 mg Oral Tablet, Delayed Release (E.C.) Take 1 Tablet (325 mg total) by mouth    baclofen  (LIORESAL ) 10 mg Oral Tablet Take 1 Tablet (10 mg total) by mouth Three times a day    BREO ELLIPTA  100-25 mcg/dose Inhalation Disk with Device Take 1 Inhalation by inhalation Once a day    chlorhexidine gluconate (PERIDEX) 0.12 % Mucous Membrane Mouthwash 15 mL    cyanocobalamin (VITAMIN B 12) 1,000 mcg Oral Tablet Take 1 Tablet (1,000 mcg total) by mouth    famotidine (PEPCID) 20 mg Oral Tablet Take 1 Tablet (20 mg total) by mouth    furosemide  (LASIX ) 20 mg Oral Tablet Take 1 Tablet (20 mg total) by mouth Daily    ipratropium bromide  (ATROVENT ) 42 mcg (0.06 %) Nasal Spray, Non-Aerosol Administer 2 Sprays into affected nostril(s) Three times a day    levothyroxine  (SYNTHROID) 100 mcg Oral Tablet Take 1 tablet by mouth once daily    magnesium oxide (MAG-OX) 400 mg Oral Tablet Take 1 Tablet (400 mg total) by mouth    pentoxifylline (TRENTAL) 400 mg Oral Tablet Sustained Release Take 1 Tablet (400 mg total) by mouth    pregabalin (LYRICA) 75 mg Oral Capsule Take 1 Capsule (75 mg total) by mouth Four times a day    spironolactone (ALDACTONE) 100 mg Oral Tablet Take 1 Tablet (100 mg total) by mouth Twice daily    triamcinolone  acetonide 0.1 % Ointment APPLY0.5 G OF OINTMENT TOPICALLY TO AFFECTED AREA TWICE DAILY    trimethoprim-sulfamethoxazole (BACTRIM DS) 160-800mg  per tablet Take 1 Tablet (160 mg total) by mouth Daily    vitamin E 400 unit Oral Capsule Take 1 Capsule (400 Units total) by mouth     No Known Allergies    PHYSICAL EXAM:  BP (!) 166/78 (Site: Right Arm, Patient Position: Sitting, Cuff Size: Adult Large)  Pulse 78    Temp 36.2 C (97.2 F) (Temporal)   Ht 1.803 m (5\' 11" )   Wt 104 kg (229 lb 9.6 oz)   SpO2 97%   BMI 32.02 kg/m        ECOG Status: (0) Fully active, able to carry on all predisease performance without restriction   Physical Exam  Vitals reviewed.   Constitutional:       Appearance: He is obese.   Eyes:      Pupils: Pupils are equal, round, and reactive to light.   Cardiovascular:      Rate and Rhythm: Normal rate and regular rhythm.      Heart sounds: Normal heart sounds. No murmur heard.  Pulmonary:      Effort: Pulmonary effort is normal.   Musculoskeletal:         General: Normal range of motion.      Cervical back: Normal range of motion.   Lymphadenopathy:      Head:      Right side of head: No submental, submandibular, tonsillar, preauricular, posterior auricular or occipital adenopathy.      Left side of head: No submental, submandibular, tonsillar, preauricular, posterior auricular or occipital adenopathy.      Cervical: No cervical adenopathy.      Right cervical: No superficial, deep or posterior cervical adenopathy.     Left cervical: No superficial, deep or posterior cervical adenopathy.      Upper Body:      Right upper body: No supraclavicular or axillary adenopathy.      Left upper body: No supraclavicular or axillary adenopathy.      Lower Body: No right inguinal adenopathy. No left inguinal adenopathy.   Skin:     General: Skin is warm and dry.      Capillary Refill: Capillary refill takes less than 2 seconds.   Neurological:      General: No focal deficit present.      Mental Status: He is alert and oriented to person, place, and time. Mental status is at baseline.      Motor: Motor function is intact.      Coordination: Coordination is intact.      Gait: Gait is intact.   Psychiatric:         Attention and Perception: Attention normal.         Mood and Affect: Mood and affect normal.         Speech: Speech normal.         Behavior: Behavior normal.         Thought Content: Thought content  normal.         Cognition and Memory: Cognition normal.         Judgment: Judgment normal.         LABS:   CBC  Diff   Lab Results   Component Value Date/Time    WBC 8.2 04/08/2024 12:23 PM    HGB 13.3 04/08/2024 12:23 PM    HCT 38.8 04/08/2024 12:23 PM    PLTCNT 204 04/08/2024 12:23 PM    RBC 3.86 (L) 04/08/2024 12:23 PM    MCV 100.7 (H) 04/08/2024 12:23 PM    MCHC 34.3 04/08/2024 12:23 PM    MCH 34.6 (H) 04/08/2024 12:23 PM    RDW 15.2 04/08/2024 12:23 PM    MPV 9.3 04/08/2024 12:23 PM    Lab Results   Component Value Date/Time    PMNS 68 04/08/2024 12:23 PM  LYMPHOCYTES 20 04/08/2024 12:23 PM    EOSINOPHIL 1 04/08/2024 12:23 PM    MONOCYTES 10 04/08/2024 12:23 PM    BASOPHILS 0 04/08/2024 12:23 PM    BASOPHILS 0.00 04/08/2024 12:23 PM    PMNABS 5.60 04/08/2024 12:23 PM    LYMPHSABS 1.70 04/08/2024 12:23 PM    EOSABS 0.10 04/08/2024 12:23 PM    MONOSABS 0.80 04/08/2024 12:23 PM            Comprehensive Metabolic Profile    Lab Results   Component Value Date    SODIUM 135 (L) 04/08/2024    POTASSIUM 5.1 04/08/2024    CHLORIDE 106 04/08/2024    CO2 23 04/08/2024    ANIONGAP 6 04/08/2024    BUN 33 (H) 04/08/2024    CREATININE 1.90 (H) 04/08/2024    ALBUMIN 3.9 04/08/2024    CALCIUM 9.6 04/08/2024    GLUCOSENF 102 04/08/2024    ALKPHOS 83 04/08/2024    ALT 19 04/08/2024    AST 19 04/08/2024    TOTBILIRUBIN 0.3 04/08/2024    TOTALPROTEIN 6.8 04/08/2024         ESTIMATED GFR   Date Value Ref Range Status   04/08/2024 39 (L) >59 mL/min/1.90m^2 Final        SLC, SIEP  Lab Results   Component Value Date    KAPPA FREE LIGHT CHAINS 2.93 01/08/2024    KAPPA FREE LIGHT CHAINS 2.93 01/08/2024    LAMBDA FREE LIGHT CHAINS 2.32 01/08/2024    LAMBDA FREE LIGHT CHAINS 2.32 01/08/2024    KAPPA/LAMBDA FLC RATIO 1.26 01/08/2024    KAPPA/LAMBDA FLC RATIO 1.26 01/08/2024      ASSESSMENT:      ICD-10-CM    1. Multiple myeloma, remission status unspecified (CMS HCC)  C90.00           PLAN:   1. Multiple myeloma:  The patient is status  post VAD induction, stem cell transplant, thalidomide maintenance followed by lenalidomide maintenance.  He has been on observation since 2018 and has not had any evidence of recurrence since that time.  We are basically following him as an [facility] patient, and we will recheck laboratory studies every 6 months.  He will have myeloma specific lab studies completed today as ordered.  2. Osteonecrosis of the jaw:  No further bisphosphonate therapy is planned.    Marla Sills was given the chance to ask questions, and these were answered to their satisfaction. The patient is welcome to call with any questions or concerns in the meantime.       On the day of the encounter, a total of 34 minutes was spent on this patient encounter including review of historical information, examination, documentation and post-visit activities.   Return in about 3 months (around 07/10/2024).   Marzette Solders, APRN,FNP-BC ,04/09/2024 ,10:35     The patient's insurance company bears full legal and financial responsibility resulting from any deviations that they cause to my recommended treatment plan.   CC:  Rufus Council Goodlow, DO  [address]  [place] [address]    McClanahan, Rufus Council, DO  [address]  [place],  [place] [address]    This note was partially generated using MModal Fluency Direct system, and there may be some incorrect words, spellings, and punctuation that were not noted in checking the note before saving.

## 2024-04-09 NOTE — Telephone Encounter (Signed)
-----   Message from [facility], DO sent at 04/09/2024  7:56 AM EDT -----  A1c ok at 6.6  Cholesterol worse though, so try to work on that  Kidney function also worse. Try to increase fluid and watch how much ibuprofen/alleve he is taking  Thyroid  and gout levels ok

## 2024-04-10 LAB — LDL CHOLESTEROL, DIRECT: LDL DIRECT: 186 mg/dL — ABNORMAL HIGH (ref <100)

## 2024-05-11 ENCOUNTER — Other Ambulatory Visit (RURAL_HEALTH_CENTER): Payer: Self-pay | Admitting: INTERNAL MEDICINE

## 2024-06-14 ENCOUNTER — Ambulatory Visit: Attending: INTERNAL MEDICINE | Admitting: INTERNAL MEDICINE

## 2024-06-14 ENCOUNTER — Encounter (INDEPENDENT_AMBULATORY_CARE_PROVIDER_SITE_OTHER): Payer: Self-pay | Admitting: INTERNAL MEDICINE

## 2024-06-14 ENCOUNTER — Other Ambulatory Visit: Payer: Self-pay

## 2024-06-14 VITALS — BP 145/71 | HR 77 | Resp 18 | Ht 71.0 in | Wt 218.4 lb

## 2024-06-14 DIAGNOSIS — J680 Bronchitis and pneumonitis due to chemicals, gases, fumes and vapors: Secondary | ICD-10-CM | POA: Insufficient documentation

## 2024-06-14 DIAGNOSIS — J4520 Mild intermittent asthma, uncomplicated: Secondary | ICD-10-CM | POA: Insufficient documentation

## 2024-06-14 MED ORDER — CLINDAMYCIN HCL 300 MG CAPSULE
300.0000 mg | ORAL_CAPSULE | Freq: Three times a day (TID) | ORAL | 0 refills | Status: DC
Start: 2024-06-14 — End: 2024-07-08

## 2024-06-14 MED ORDER — ALLOPURINOL 300 MG TABLET
300.0000 mg | ORAL_TABLET | Freq: Every day | ORAL | 1 refills | Status: DC
Start: 2024-06-14 — End: 2024-10-11

## 2024-06-14 MED ORDER — METHYLPREDNISOLONE 4 MG TABLETS IN A DOSE PACK
ORAL_TABLET | ORAL | 0 refills | Status: DC
Start: 2024-06-14 — End: 2024-07-08

## 2024-06-14 NOTE — Progress Notes (Signed)
 [facility] Internal Medicine  [address] [place], [place]  [address]  Phone: [phone] Fax: [phone]    Name: Connor Patterson                       Date of Birth: 10/15/1961   MRN:  [id number]                         Date of visit: 06/14/2024     Chief Complaint: Feel Sick (C/o chest congestion, head stuffiness, and cough)    Current Outpatient Medications   Medication Sig    allopurinoL  (ZYLOPRIM ) 300 mg Oral Tablet Take 1 Tablet (300 mg total) by mouth Daily    ascorbic acid, vitamin C, (VITAMIN C) 500 mg Oral Tablet Take 1 Tablet (500 mg total) by mouth    aspirin (ECOTRIN) 325 mg Oral Tablet, Delayed Release (E.C.) Take 1 Tablet (325 mg total) by mouth    baclofen  (LIORESAL ) 10 mg Oral Tablet Take 1 Tablet (10 mg total) by mouth Three times a day    BREO ELLIPTA  100-25 mcg/dose Inhalation Disk with Device Take 1 Inhalation by inhalation Once a day    carvediloL (COREG) 3.125 mg Oral Tablet Take 1 Tablet (3.125 mg total) by mouth Twice daily    chlorhexidine gluconate (PERIDEX) 0.12 % Mucous Membrane Mouthwash 15 mL    cyanocobalamin (VITAMIN B 12) 1,000 mcg Oral Tablet Take 1 Tablet (1,000 mcg total) by mouth    famotidine (PEPCID) 20 mg Oral Tablet Take 1 Tablet (20 mg total) by mouth    furosemide  (LASIX ) 20 mg Oral Tablet Take 1 Tablet (20 mg total) by mouth Daily    ipratropium bromide  (ATROVENT ) 42 mcg (0.06 %) Nasal Spray, Non-Aerosol Administer 2 Sprays into affected nostril(s) Three times a day    levothyroxine  (SYNTHROID) 100 mcg Oral Tablet Take 1 tablet by mouth once daily    magnesium oxide (MAG-OX) 400 mg Oral Tablet Take 1 Tablet (400 mg total) by mouth    pentoxifylline (TRENTAL) 400 mg Oral Tablet Sustained Release Take 1 Tablet (400 mg total) by mouth    pregabalin (LYRICA) 75 mg Oral Capsule Take 1 Capsule (75 mg total) by mouth Four times a day    spironolactone (ALDACTONE) 100 mg Oral Tablet Take 1 Tablet (100 mg total) by mouth Twice daily    triamcinolone  acetonide 0.1 %  Ointment APPLY0.5 G OF OINTMENT TOPICALLY TO AFFECTED AREA TWICE DAILY    trimethoprim-sulfamethoxazole (BACTRIM DS) 160-800mg  per tablet Take 1 Tablet (160 mg total) by mouth Daily    vitamin E 400 unit Oral Capsule Take 1 Capsule (400 Units total) by mouth       Patient Active Problem List    Diagnosis Date Noted    Type 2 diabetes mellitus with stage 3a chronic kidney disease, without long-term current use of insulin (CMS HCC) 04/08/2024    Chronic diastolic CHF (congestive heart failure) (CMS HCC) 04/08/2024    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    Benign hypertension 04/08/2022    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/08/2022    Type 2 diabetes mellitus without complication, without long-term current use of insulin 04/08/2022    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) 04/08/2022    Mixed hyperlipidemia 04/08/2022    Acquired hypothyroidism  04/08/2022    TIA (transient ischemic attack) 04/08/2022    Gout 04/08/2022    Chronic nausea 04/08/2022    Osteoarthritis 04/08/2022    Chronic low back pain 04/08/2022    Bilateral sciatica 04/08/2022    Mild intermittent asthma without complication 04/08/2022    Peripheral vascular disease (CMS HCC) 04/08/2022    Gastroesophageal reflux disease without esophagitis 04/08/2022    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     Complaining of nonproductive cough, head and chest congestion. Ongoing 3 days. States he was painting his basement with dry lock and did not wear a mask or use ventilation.     Incidentally Dr Sheralyn added carvedilol since BP elevated a few weeks ago.     ROS:  10 systems reviewed and were negative except as noted.   + edema  + nausea  + abnormal gait, back pain, joint  pain, muscle cramps, muscle weakness, numbness, tingling    Objective :  BP (!) 145/71 (Site: Left Arm, Patient Position: Sitting, Cuff Size: Adult)   Pulse 77   Resp 18   Ht 1.803 m (5' 11)   Wt 99.1 kg (218 lb 6.4 oz)   SpO2 98%   BMI 30.46 kg/m       General:  appears chronically ill and moderately obese  Lungs:  wheezes throughout all lung fields  Cardiovascular:  regular rate and rhythm, S1, S2 normal, no murmur, click, rub or gallop  Neurologic:  Grossly normal. Alert and oriented x3  Psychiatric:  Normal    Data reviewed:      Assessment/Plan:  Assessment/Plan   1. Acute pneumonitis due to chemical fumes (CMS HCC)    2. Mild intermittent asthma without complication      Steroids, Abx as precaution  Continue inhaler  Counseling and education on appropriate PPE    Ok to continue carvedilol and aldactone with the breo    Camellia Glendia Broaden, DO, [facility]  Internal Medicine

## 2024-06-14 NOTE — Nursing Note (Signed)
 Patient is here with c/o chest congestion, head stuffiness, and a cough after using dri-lok on his basement.

## 2024-07-06 ENCOUNTER — Other Ambulatory Visit (INDEPENDENT_AMBULATORY_CARE_PROVIDER_SITE_OTHER): Payer: Self-pay | Admitting: NURSE PRACTITIONER

## 2024-07-06 DIAGNOSIS — C9000 Multiple myeloma not having achieved remission: Secondary | ICD-10-CM

## 2024-07-08 ENCOUNTER — Ambulatory Visit: Payer: Self-pay | Attending: NURSE PRACTITIONER | Admitting: NURSE PRACTITIONER

## 2024-07-08 ENCOUNTER — Encounter (INDEPENDENT_AMBULATORY_CARE_PROVIDER_SITE_OTHER): Payer: Self-pay | Admitting: NURSE PRACTITIONER

## 2024-07-08 ENCOUNTER — Ambulatory Visit (INDEPENDENT_AMBULATORY_CARE_PROVIDER_SITE_OTHER)
Admission: RE | Admit: 2024-07-08 | Discharge: 2024-07-08 | Disposition: A | Discharge status: Home / Self Care | Payer: Self-pay | Source: Ambulatory Visit | Attending: NURSE PRACTITIONER | Admitting: NURSE PRACTITIONER

## 2024-07-08 ENCOUNTER — Other Ambulatory Visit: Payer: Self-pay

## 2024-07-08 VITALS — BP 137/73 | HR 77 | Temp 96.7°F | Ht 71.0 in | Wt 215.5 lb

## 2024-07-08 DIAGNOSIS — M879 Osteonecrosis, unspecified: Secondary | ICD-10-CM | POA: Insufficient documentation

## 2024-07-08 DIAGNOSIS — C9000 Multiple myeloma not having achieved remission: Secondary | ICD-10-CM | POA: Insufficient documentation

## 2024-07-08 DIAGNOSIS — Z9484 Stem cells transplant status: Secondary | ICD-10-CM | POA: Insufficient documentation

## 2024-07-08 LAB — COMPREHENSIVE METABOLIC PANEL, NON-FASTING
ALBUMIN/GLOBULIN RATIO: 1.2 (ref 0.8–1.4)
ALBUMIN: 4 g/dL (ref 3.5–5.7)
ALKALINE PHOSPHATASE: 109 U/L — ABNORMAL HIGH (ref 34–104)
ALT (SGPT): 21 U/L (ref 7–52)
ANION GAP: 7 mmol/L (ref 4–13)
AST (SGOT): 19 U/L (ref 13–39)
BILIRUBIN TOTAL: 0.3 mg/dL (ref 0.3–1.0)
BUN/CREA RATIO: 18 (ref 6–22)
BUN: 28 mg/dL — ABNORMAL HIGH (ref 7–25)
CALCIUM, CORRECTED: 10.3 mg/dL (ref 8.9–10.8)
CALCIUM: 10.3 mg/dL (ref 8.6–10.3)
CHLORIDE: 105 mmol/L (ref 98–107)
CO2 TOTAL: 23 mmol/L (ref 21–31)
CREATININE: 1.55 mg/dL — ABNORMAL HIGH (ref 0.60–1.30)
ESTIMATED GFR: 50 mL/min/1.73mˆ2 — ABNORMAL LOW (ref >59)
GLOBULIN: 3.3 (ref 2.0–3.5)
GLUCOSE: 144 mg/dL — ABNORMAL HIGH (ref 74–109)
OSMOLALITY, CALCULATED: 278 mosm/kg (ref 270–290)
POTASSIUM: 4.8 mmol/L (ref 3.5–5.1)
PROTEIN TOTAL: 7.3 g/dL (ref 6.4–8.9)
SODIUM: 135 mmol/L — ABNORMAL LOW (ref 136–145)

## 2024-07-08 LAB — CBC WITH DIFF
BASOPHIL #: 0 x10ˆ3/uL (ref 0.00–0.10)
BASOPHIL %: 1 % (ref 0–1)
EOSINOPHIL #: 0.1 x10ˆ3/uL (ref 0.00–0.60)
EOSINOPHIL %: 1 % (ref 1–8)
HCT: 42.6 % (ref 36.7–47.1)
HGB: 14.8 g/dL (ref 12.5–16.3)
LYMPHOCYTE #: 1.8 x10ˆ3/uL (ref 1.00–3.00)
LYMPHOCYTE %: 22 % (ref 15–43)
MCH: 35 pg — ABNORMAL HIGH (ref 23.8–33.4)
MCHC: 34.8 g/dL (ref 32.5–36.3)
MCV: 100.5 fL — ABNORMAL HIGH (ref 73.0–96.2)
MONOCYTE #: 0.7 x10ˆ3/uL (ref 0.30–1.10)
MONOCYTE %: 9 % (ref 6–14)
MPV: 8.3 fL (ref 7.4–11.4)
NEUTROPHIL #: 5.2 x10ˆ3/uL (ref 1.85–7.84)
NEUTROPHIL %: 67 % (ref 44–74)
PLATELETS: 232 x10ˆ3/uL (ref 140–440)
RBC: 4.24 x10ˆ6/uL (ref 4.06–5.63)
RDW: 14.8 % (ref 12.1–16.2)
WBC: 7.8 x10ˆ3/uL (ref 3.6–10.2)

## 2024-07-08 LAB — PROTEIN FOR ELECTROPHORESIS: PROTEIN TOTAL: 6.7 g/dL (ref 5.6–7.6)

## 2024-07-08 LAB — ALBUMIN FOR ELECTROPHORESIS: ALBUMIN: 3.5 g/dL (ref 3.4–4.8)

## 2024-07-08 NOTE — Cancer Center Note (Signed)
 Department of Hematology/Oncology  Progress Note   Name: Connor Patterson  FMW:[id number]  Date of Birth: 02/28/1961  Encounter Date: 07/08/2024    REFERRING PROVIDER:  Wallie Camellia Hamilton, DO  [address]  [place],  [place] [address]     REASON FOR OFFICE VISIT:  Multiple Myeloma     HISTORY OF PRESENT ILLNESS:  Connor Patterson is a 63 y.o. male who presents today for follow up of multiple myeloma.  He was originally diagnosed in January 2004, and at that time had renal failure and a respiratory infection his serum electrophoresis showed a monoclonal protein of 0.1 grams/deciliter.  His bone marrow biopsy at that time was consistent with the presence of multiple myeloma, and a skeletal survey showed numerous lytic lesions and he also had an elevated calcium level.     He was being seen at [facility] and had VAD chemotherapy for 4 cycles.  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  He was treated with thalidomide in a maintenance fashion.  He developed osteonecrosis of the jaw in 2013, and his bisphosphonate therapy was discontinued at that time.  He was apparently switch to Revlimid for maintenance treatment, and it was discontinued in 2017 due to poor tolerance.    Since 2018, the patient has been off of any maintenance therapy.  His monoclonal protein levels have been very low and he has been considered to still be in remission.  He is being followed with observation and continues to follow up at [facility].    01/08/2024: The patient is here for follow up of multiple myeloma.  He has been off of maintenance therapy and is having his labs rechecked every 6 months.  He states that he is doing well other than continued back pain. He is following up with a spine specialist at [facility] for this. He states that he is otherwise doing well and denies any other complaints/issues at this time.     04/09/2024: The patient is here today for follow up of multiple myeloma. He states that he followed up with Dr.  Wallie yesterday and was told his triglycerides and A1C were elevated. He followed up with [facility] on 03/23/24 and was told everything was stable and he is to return for follow up in 6 months.     07/08/2024: The patient is here today for follow up of multiple myeloma. He states that he has lost weight since his last visit. He has been working with his cardiologist to better control his BP. He has had to separate his HCTZ and BP medication by 4 hours and he takes them both twice daily. He denies any other complaints or issues at this time.     ROS: ...  Past Medical History:   Diagnosis Date    Asthma     Diabetes mellitus, type 2     Essential hypertension     Hx of transfusion        Past Surgical History:   Procedure Laterality Date    HX BACK SURGERY      3 times    HX COLONOSCOPY      LIMBAL STEM CELL TRANSPLANT      RENAL BIOPSY, OPEN         Social History     Socioeconomic History    Marital status: Married     Spouse name: Sherrilyn    Number of children: 2    Years of education: Not on file  Highest education level: Not on file   Occupational History    Not on file   Tobacco Use    Smoking status: Never    Smokeless tobacco: Former   Advertising account planner    Vaping status: Never Used   Substance and Sexual Activity    Alcohol use: Never    Drug use: Never    Sexual activity: Not on file   Other Topics Concern    Not on file   Social History Narrative    Not on file     Social Determinants of Health     Financial Resource Strain: Not on file   Transportation Needs: No Transportation Needs (03/23/2024)    Received from [facility]    Transportation     In the past 12 months, has lack of reliable transportation kept you from medical appointments, meetings, work or from getting things needed for daily living? : No   Social Connections: Not on file   Intimate Partner Violence: Not on file   Housing Stability: Low Risk  (03/23/2024)    Received from [facility]    Housing Stability Vital Sign     What is your living  situation today?: I have a steady place to live     Think about the place you live. Do you have problems with any of the following? Choose all that apply:: None/None on this list     Family Medical History:       Problem Relation (Age of Onset)    Autoimmune disease Mother    Carotid Stenosis Mother    Hodgkin's lymphoma Father            Current Outpatient Medications   Medication Sig    allopurinoL  (ZYLOPRIM ) 300 mg Oral Tablet Take 1 Tablet (300 mg total) by mouth Daily    ascorbic acid, vitamin C, (VITAMIN C) 500 mg Oral Tablet Take 1 Tablet (500 mg total) by mouth    aspirin (ECOTRIN) 325 mg Oral Tablet, Delayed Release (E.C.) Take 1 Tablet (325 mg total) by mouth    B cmplx 4/vit D3/C/folic/zinc (VITAL-D RX ORAL) Take 1,000 mcg by mouth    baclofen  (LIORESAL ) 10 mg Oral Tablet Take 1 Tablet (10 mg total) by mouth Three times a day    BREO ELLIPTA  100-25 mcg/dose Inhalation Disk with Device Take 1 Inhalation by inhalation Once a day    carvediloL (COREG) 3.125 mg Oral Tablet Take 1 Tablet (3.125 mg total) by mouth Twice daily    chlorhexidine gluconate (PERIDEX) 0.12 % Mucous Membrane Mouthwash 15 mL    cyanocobalamin (VITAMIN B 12) 1,000 mcg Oral Tablet Take 1 Tablet (1,000 mcg total) by mouth    famotidine (PEPCID) 20 mg Oral Tablet Take 1 Tablet (20 mg total) by mouth    furosemide  (LASIX ) 20 mg Oral Tablet Take 1 Tablet (20 mg total) by mouth Daily    ipratropium bromide  (ATROVENT ) 42 mcg (0.06 %) Nasal Spray, Non-Aerosol Administer 2 Sprays into affected nostril(s) Three times a day    levothyroxine  (SYNTHROID) 100 mcg Oral Tablet Take 1 tablet by mouth once daily    magnesium oxide (MAG-OX) 400 mg Oral Tablet Take 1 Tablet (400 mg total) by mouth    Methylprednisolone  (MEDROL  DOSEPACK) 4 mg Oral Tablets, Dose Pack Take as instructed.    pentoxifylline (TRENTAL) 400 mg Oral Tablet Sustained Release Take 1 Tablet (400 mg total) by mouth    pregabalin (LYRICA) 75 mg Oral Capsule Take 1 Capsule (75 mg  total)  by mouth Four times a day    spironolactone (ALDACTONE) 100 mg Oral Tablet Take 1 Tablet (100 mg total) by mouth Twice daily    triamcinolone  acetonide 0.1 % Ointment APPLY0.5 G OF OINTMENT TOPICALLY TO AFFECTED AREA TWICE DAILY    trimethoprim-sulfamethoxazole (BACTRIM DS) 160-800mg  per tablet Take 1 Tablet (160 mg total) by mouth Daily    vitamin E 400 unit Oral Capsule Take 1 Capsule (400 Units total) by mouth     No Known Allergies    PHYSICAL EXAM:  BP 137/73 (Site: Left Arm, Patient Position: Sitting, Cuff Size: Adult)   Pulse 77   Temp (!) 35.9 C (96.7 F) (Temporal)   Ht 1.803 m (5' 11)   Wt 97.8 kg (215 lb 8 oz)   SpO2 97%   BMI 30.06 kg/m        ECOG Status: (0) Fully active, able to carry on all predisease performance without restriction   Physical Exam  Vitals reviewed.   Eyes:      Pupils: Pupils are equal, round, and reactive to light.   Cardiovascular:      Rate and Rhythm: Normal rate and regular rhythm.      Heart sounds: Normal heart sounds. No murmur heard.  Pulmonary:      Effort: Pulmonary effort is normal.   Musculoskeletal:         General: Normal range of motion.      Cervical back: Normal range of motion.   Lymphadenopathy:      Head:      Right side of head: No submental, submandibular, tonsillar, preauricular, posterior auricular or occipital adenopathy.      Left side of head: No submental, submandibular, tonsillar, preauricular, posterior auricular or occipital adenopathy.      Cervical: No cervical adenopathy.      Right cervical: No superficial, deep or posterior cervical adenopathy.     Left cervical: No superficial, deep or posterior cervical adenopathy.      Upper Body:      Right upper body: No supraclavicular or axillary adenopathy.      Left upper body: No supraclavicular or axillary adenopathy.      Lower Body: No right inguinal adenopathy. No left inguinal adenopathy.   Skin:     General: Skin is warm and dry.      Capillary Refill: Capillary refill takes less than 2  seconds.   Neurological:      General: No focal deficit present.      Mental Status: He is alert and oriented to person, place, and time. Mental status is at baseline.      Motor: Motor function is intact.      Coordination: Coordination is intact.      Gait: Gait is intact.   Psychiatric:         Attention and Perception: Attention normal.         Mood and Affect: Mood and affect normal.         Speech: Speech normal.         Behavior: Behavior normal.         Thought Content: Thought content normal.         Cognition and Memory: Cognition normal.         Judgment: Judgment normal.         LABS:   CBC  Diff   Lab Results   Component Value Date/Time    WBC 7.8 07/08/2024 10:23 AM  HGB 14.8 07/08/2024 10:23 AM    HCT 42.6 07/08/2024 10:23 AM    PLTCNT 232 07/08/2024 10:23 AM    RBC 4.24 07/08/2024 10:23 AM    MCV 100.5 (H) 07/08/2024 10:23 AM    MCHC 34.8 07/08/2024 10:23 AM    MCH 35.0 (H) 07/08/2024 10:23 AM    RDW 14.8 07/08/2024 10:23 AM    MPV 8.3 07/08/2024 10:23 AM    Lab Results   Component Value Date/Time    PMNS 67 07/08/2024 10:23 AM    LYMPHOCYTES 22 07/08/2024 10:23 AM    EOSINOPHIL 1 07/08/2024 10:23 AM    MONOCYTES 9 07/08/2024 10:23 AM    BASOPHILS 1 07/08/2024 10:23 AM    BASOPHILS 0.00 07/08/2024 10:23 AM    PMNABS 5.20 07/08/2024 10:23 AM    LYMPHSABS 1.80 07/08/2024 10:23 AM    EOSABS 0.10 07/08/2024 10:23 AM    MONOSABS 0.70 07/08/2024 10:23 AM            Comprehensive Metabolic Profile    Lab Results   Component Value Date    SODIUM 135 (L) 07/08/2024    POTASSIUM 4.8 07/08/2024    CHLORIDE 105 07/08/2024    CO2 23 07/08/2024    ANIONGAP 7 07/08/2024    BUN 28 (H) 07/08/2024    CREATININE 1.55 (H) 07/08/2024    ALBUMIN 4.0 07/08/2024    CALCIUM 10.3 07/08/2024    GLUCOSENF 144 (H) 07/08/2024    ALKPHOS 109 (H) 07/08/2024    ALT 21 07/08/2024    AST 19 07/08/2024    TOTBILIRUBIN 0.3 07/08/2024    TOTALPROTEIN 7.3 07/08/2024         ESTIMATED GFR   Date Value Ref Range Status   07/08/2024 50 (L)  >59 mL/min/1.55m^2 Final        SLC, SIEP  Lab Results   Component Value Date    KAPPA FREE LIGHT CHAINS 2.93 01/08/2024    KAPPA FREE LIGHT CHAINS 2.93 01/08/2024    LAMBDA FREE LIGHT CHAINS 2.32 01/08/2024    LAMBDA FREE LIGHT CHAINS 2.32 01/08/2024    KAPPA/LAMBDA FLC RATIO 1.26 01/08/2024    KAPPA/LAMBDA FLC RATIO 1.26 01/08/2024      ASSESSMENT:      ICD-10-CM    1. Multiple myeloma, remission status unspecified (CMS HCC)  C90.00         PLAN:   1. Multiple myeloma:  The patient is status post VAD induction, stem cell transplant, thalidomide maintenance followed by lenalidomide maintenance.  He has been on observation since 2018 and has not had any evidence of recurrence since that time.  We are basically following him as an [facility] patient, and we will recheck laboratory studies every 6 months.  He will have myeloma specific lab studies completed today as ordered.  2. Osteonecrosis of the jaw:  No further bisphosphonate therapy is planned.    Elsie CHRISTELLA Gilmore was given the chance to ask questions, and these were answered to their satisfaction. The patient is welcome to call with any questions or concerns in the meantime.       On the day of the encounter, a total of 22 minutes was spent on this patient encounter including review of historical information, examination, documentation and post-visit activities.   Return in about 3 months (around 10/08/2024).   Eleanor Sers, APRN,FNP-BC ,07/08/2024 ,11:17     The patient's insurance company bears full legal and financial responsibility resulting from any deviations that they cause to my  recommended treatment plan.   CC:  Camellia Hamilton Deepstep, DO  [address]  [place] [address]    [place], Camellia Hamilton, DO  [address]  [place],  [place] [address]    This note was partially generated using MModal Fluency Direct system, and there may be some incorrect words, spellings, and punctuation that were not noted in checking the note before saving.

## 2024-07-09 ENCOUNTER — Ambulatory Visit: Payer: Self-pay | Attending: INTERNAL MEDICINE | Admitting: INTERNAL MEDICINE

## 2024-07-09 ENCOUNTER — Encounter (INDEPENDENT_AMBULATORY_CARE_PROVIDER_SITE_OTHER): Payer: Self-pay | Admitting: INTERNAL MEDICINE

## 2024-07-09 VITALS — BP 135/75 | HR 68 | Resp 16 | Ht 71.0 in | Wt 215.2 lb

## 2024-07-09 DIAGNOSIS — T458X5A Adverse effect of other primarily systemic and hematological agents, initial encounter: Secondary | ICD-10-CM | POA: Insufficient documentation

## 2024-07-09 DIAGNOSIS — G8929 Other chronic pain: Secondary | ICD-10-CM | POA: Insufficient documentation

## 2024-07-09 DIAGNOSIS — R110 Nausea: Secondary | ICD-10-CM | POA: Insufficient documentation

## 2024-07-09 DIAGNOSIS — I5032 Chronic diastolic (congestive) heart failure: Secondary | ICD-10-CM | POA: Insufficient documentation

## 2024-07-09 DIAGNOSIS — C9001 Multiple myeloma in remission: Secondary | ICD-10-CM | POA: Insufficient documentation

## 2024-07-09 DIAGNOSIS — Z114 Encounter for screening for human immunodeficiency virus [HIV]: Secondary | ICD-10-CM | POA: Insufficient documentation

## 2024-07-09 DIAGNOSIS — I739 Peripheral vascular disease, unspecified: Secondary | ICD-10-CM | POA: Insufficient documentation

## 2024-07-09 DIAGNOSIS — M5442 Lumbago with sciatica, left side: Secondary | ICD-10-CM | POA: Insufficient documentation

## 2024-07-09 DIAGNOSIS — J4520 Mild intermittent asthma, uncomplicated: Secondary | ICD-10-CM | POA: Insufficient documentation

## 2024-07-09 DIAGNOSIS — Z23 Encounter for immunization: Secondary | ICD-10-CM | POA: Insufficient documentation

## 2024-07-09 DIAGNOSIS — Z789 Other specified health status: Secondary | ICD-10-CM | POA: Insufficient documentation

## 2024-07-09 DIAGNOSIS — M150 Primary generalized (osteo)arthritis: Secondary | ICD-10-CM | POA: Insufficient documentation

## 2024-07-09 DIAGNOSIS — Z1159 Encounter for screening for other viral diseases: Secondary | ICD-10-CM | POA: Insufficient documentation

## 2024-07-09 DIAGNOSIS — E039 Hypothyroidism, unspecified: Secondary | ICD-10-CM | POA: Insufficient documentation

## 2024-07-09 DIAGNOSIS — R600 Localized edema: Secondary | ICD-10-CM | POA: Insufficient documentation

## 2024-07-09 DIAGNOSIS — M87180 Osteonecrosis due to drugs, jaw: Secondary | ICD-10-CM | POA: Insufficient documentation

## 2024-07-09 DIAGNOSIS — E782 Mixed hyperlipidemia: Secondary | ICD-10-CM | POA: Insufficient documentation

## 2024-07-09 DIAGNOSIS — E119 Type 2 diabetes mellitus without complications: Secondary | ICD-10-CM | POA: Insufficient documentation

## 2024-07-09 DIAGNOSIS — N1831 Chronic kidney disease, stage 3a: Secondary | ICD-10-CM | POA: Insufficient documentation

## 2024-07-09 DIAGNOSIS — M1A3790 Chronic gout due to renal impairment, unspecified ankle and foot, without tophus (tophi): Secondary | ICD-10-CM | POA: Insufficient documentation

## 2024-07-09 DIAGNOSIS — E1122 Type 2 diabetes mellitus with diabetic chronic kidney disease: Secondary | ICD-10-CM | POA: Insufficient documentation

## 2024-07-09 DIAGNOSIS — Z9481 Bone marrow transplant status: Secondary | ICD-10-CM | POA: Insufficient documentation

## 2024-07-09 DIAGNOSIS — K219 Gastro-esophageal reflux disease without esophagitis: Secondary | ICD-10-CM | POA: Insufficient documentation

## 2024-07-09 DIAGNOSIS — M5441 Lumbago with sciatica, right side: Secondary | ICD-10-CM | POA: Insufficient documentation

## 2024-07-09 DIAGNOSIS — I10 Essential (primary) hypertension: Secondary | ICD-10-CM | POA: Insufficient documentation

## 2024-07-09 LAB — KAPPA AND LAMBDA FREE LIGHT CHAINS, SERUM
KAPPA FREE LIGHT CHAINS: 3.23 mg/dL (ref 1.25–3.25)
KAPPA/LAMBDA FLC RATIO: 0.98 (ref 0.80–2.10)
LAMBDA FREE LIGHT CHAINS: 3.31 mg/dL — ABNORMAL HIGH (ref 0.60–2.70)

## 2024-07-09 LAB — MONOCLONAL GAMMOPATHY PROFILE WITH IMMUNOTYPING REFLEX
ALBUMIN: 3.5 g/dL
KAPPA FREE LIGHT CHAINS: 3.23 mg/dL (ref 1.25–3.25)
KAPPA/LAMBDA FLC RATIO: 0.98 (ref 0.80–2.10)
LAMBDA FREE LIGHT CHAINS: 3.31 mg/dL — ABNORMAL HIGH (ref 0.60–2.70)
TOTAL PROTEIN: 6.7 g/dL

## 2024-07-09 NOTE — Progress Notes (Signed)
 [facility]  [address] [place], [place]  [address]  Phone: [phone] Fax: [phone]    Name: Connor Patterson                       Date of Birth: [birth year]   MRN:  [id number]                         Date of visit: 07/09/2024     Chief Complaint: Follow Up 3 Months (C/o nausea)    Current Outpatient Medications   Medication Sig    allopurinoL  (ZYLOPRIM ) 300 mg Oral Tablet Take 1 Tablet (300 mg total) by mouth Daily    ascorbic acid, vitamin C, (VITAMIN C) 500 mg Oral Tablet Take 1 Tablet (500 mg total) by mouth    aspirin (ECOTRIN) 325 mg Oral Tablet, Delayed Release (E.C.) Take 1 Tablet (325 mg total) by mouth    B cmplx 4/vit D3/C/folic/zinc (VITAL-D RX ORAL) Take 1,000 mcg by mouth    baclofen  (LIORESAL ) 10 mg Oral Tablet Take 1 Tablet (10 mg total) by mouth Three times a day    BREO ELLIPTA  100-25 mcg/dose Inhalation Disk with Device Take 1 Inhalation by inhalation Once a day    carvediloL (COREG) 3.125 mg Oral Tablet Take 1 Tablet (3.125 mg total) by mouth Twice daily    chlorhexidine gluconate (PERIDEX) 0.12 % Mucous Membrane Mouthwash 15 mL    cyanocobalamin (VITAMIN B 12) 1,000 mcg Oral Tablet Take 1 Tablet (1,000 mcg total) by mouth    famotidine (PEPCID) 20 mg Oral Tablet Take 1 Tablet (20 mg total) by mouth    furosemide  (LASIX ) 20 mg Oral Tablet Take 1 Tablet (20 mg total) by mouth Daily    ipratropium bromide  (ATROVENT ) 42 mcg (0.06 %) Nasal Spray, Non-Aerosol Administer 2 Sprays into affected nostril(s) Three times a day    levothyroxine  (SYNTHROID) 100 mcg Oral Tablet Take 1 tablet by mouth once daily    magnesium oxide (MAG-OX) 400 mg Oral Tablet Take 1 Tablet (400 mg total) by mouth    pentoxifylline (TRENTAL) 400 mg Oral Tablet Sustained Release Take 1 Tablet (400 mg total) by mouth    pregabalin (LYRICA) 75 mg Oral Capsule Take 1 Capsule (75 mg total) by mouth Four times a day    spironolactone (ALDACTONE) 100 mg Oral Tablet Take 1 Tablet (100 mg total) by mouth  Twice daily    triamcinolone  acetonide 0.1 % Ointment APPLY0.5 G OF OINTMENT TOPICALLY TO AFFECTED AREA TWICE DAILY    trimethoprim-sulfamethoxazole (BACTRIM DS) 160-800mg  per tablet Take 1 Tablet (160 mg total) by mouth Daily    vitamin E 400 unit Oral Capsule Take 1 Capsule (400 Units total) by mouth       Patient Active Problem List    Diagnosis Date Noted    Type 2 diabetes mellitus with stage 3a chronic kidney disease, without long-term current use of insulin (CMS HCC) 04/08/2024    Chronic diastolic CHF (congestive heart failure) (CMS HCC) 04/08/2024    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    Benign hypertension 04/08/2022    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/08/2022    Type 2 diabetes mellitus without complication, without long-term current use of insulin 04/08/2022    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)  04/08/2022    Mixed hyperlipidemia 04/08/2022    Acquired hypothyroidism 04/08/2022    TIA (transient ischemic attack) 04/08/2022    Gout 04/08/2022    Chronic nausea 04/08/2022    Osteoarthritis 04/08/2022    Chronic low back pain 04/08/2022    Bilateral sciatica 04/08/2022    Mild intermittent asthma without complication 04/08/2022    Peripheral vascular disease (CMS HCC) 04/08/2022    Gastroesophageal reflux disease without esophagitis 04/08/2022    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    Seen last month for chemical pneumonitis secondary to paint fumes    Notes more nausea when taking carvedilol with the spironolactone. Dr Sheralyn not interested in changing meds. But ok for patient to separate.     Increased stress due to family issues.     Hypertension  W/ Hx of renal artery stenosis  Following with  nephrology/cardiology    Diabetes mellitus type 2  Currently on glipizide   Eye exam Dr Shellia in March 2024. Everything was good per patient.   Did not tolerate DPP4.  Not a candidate for metformin. Did not tolerate SGLT2 2/2 side pain. GLPs caused nausea.   Labs on 02-04-2019 showed hemoglobin A1c was 10.2.  Glipizide  was added at that time  Labs on 05-24-2019 showed hemoglobin A1c 9.9  Patient resistant to starting insulin  09-2019 working on diet. has lost weight.  Labs on 09-06-2019 showed hemoglobin A1c 12.0, micro albumin creatnine ratio <30   * Suggested start Insulin, but patient only will accept oral agents  Labs on 12-13-2019 showed hemoglobin A1c 12.6, microalbumin creatinine ratio less than 30  03-2020 Samples of Jardiance given to patient. Tolerarated so increased to max oral dose.  06-2020 D/C Jardiance due to side pain  Labs on 06-08-2020 showed hemoglobin A1c 8.9   * Started Rybelsus   09-2020 Log 85-191  Labs on 12-15-2020 showed hemoglobin A1c 6.9  Labs on 03-16-2021 showed hemoglobin A1c 7.3  Labs on 09-10-2021 showed hemoglobin A1c 6.8  Labs on 01-07-2022 showed hemoglobin A1c 8.0  Labs on 06/26/2022 showed hemoglobin A1c 7.0, microalbumin creatinine ratio less than 30  Labs on 10/31/2022 showed hemoglobin A1c 6.5  Rybelsus  stopped secondary to nausea  Labs on 01/31/2023 showed hemoglobin A1c 6.8  Labs on 05/05/2023 showed hemoglobin A1c 6.8  Labs on 08/26/2023 showed hemoglobin A1c 6.5, microalbumin creatinine ratio less than 30  12/16/2023 showed hemoglobin A1c 6.5  04/08/2024 showed hemoglobin A1c 6.6    Multiple myeloma  Lambda light change, w/ renal disease  s/p autologous stem cell transplant, thalidomide and aredia chemotherapy  In Remission, was on Ninlaro maintenance  12-2020 Dr Herd has resumed care as they could not find anyone to cover. Patient will not need Ninlaro this year.  [phone] Saw Dr Herd. He is definitely retiring. Will be seeing Dr Mccade.  [phone] Saw Dr Julietta. No  changes.  [phone] Saw Dr Julietta. No changes. Will see [facility] tomorrow.  01-2022 Bone Scan 1. Negative exam for fractures, lytic or blastic lesions in the skull, spine, ribs, pelvis, humeri and femora.  2. 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    Mixed hyperlipidemia  Does not tolerate statins except for Livalo.  Insurance will not cover Livalo  Have tried all available non-statin oral agents, and all of proven ineffective  With diabetes he does meet the criteria for the injectables, but Cardiology does not want him on them  Labs on 02-04-2019 showed total cholesterol 244, triglycerides 280, HDL 30, direct LDL 132  Labs on 05-24-2019 showed total cholesterol 273, triglycerides 701, HDL 25, direct LDL 117  Labs on 09-06-2019 showed total cholesterol 203, triglycerides 324, HDL 33, direct LDL 17  Labs on 12-13-2019 showed total cholesterol 212, triglycerides 217, HDL 32, direct LDL 144  Labs on 06-08-2020 showed total cholesterol 227, triglycerides 179, HDL 34, direct LDL 160  08-2020 Dr Sharron resume Zetia despite lack of efficacy in prior attempt  Labs on 12-15-2020 showed total cholesterol 179, triglycerides 125, HDL 38, direct LDL 114  Labs on 03-16-2021 showed total cholesterol 192, triglycerides 102, HDL 47, calculated LDL 125  Labs on 07-04-2021 showed total cholesterol 210, triglycerides 130, HDL 38, direct LDL 152  Labs on 01-07-2022 showed total cholesterol 230, triglycerides 206, HDL 37,   Labs on 06/26/2022 showed total cholesterol 228, triglycerides 271, HDL 34, direct LDL 170  Labs on 10/31/2022 showed total cholesterol 230, triglycerides 150, HDL 36, direct LDL 170  Labs on 01/31/2023 showed total cholesterol 186, triglycerides 117, HDL 37, direct LDL 143  Labs on 05/25/2023 showed total cholesterol 223, triglycerides 68, HDL 45, direct LDL 186  Labs on 08/06/2023 showed total cholesterol 219, triglycerides 94, HDL 46, direct LDL 176  12/16/2023 showed total  cholesterol 214, triglycerides 60, HDL 44, direct LDL 174  04/08/2024 showed total cholesterol 233, triglycerides 170, HDL 40, direct LDL 186    Chronic diastolic CHF  Following with Dr Hart    Hypothyroidism  On Synthroid 100 mcg  Labs on 02-04-2019 showed TSH 1.58 on Synthroid 100 mcg  Labs on 05-24-2019 showed TSH 3.83 on Synthroid 100 mcg  Labs on 09-06-2019 showed TSH 0.90 on Synthroid 100 mcg  Labs on 12-13-2019 showed TSH 1.28 on Synthroid 100 mcg  Labs on 06-08-2020 showed TSH 1.53 on Synthroid 100 mcg  Labs on 12-15-2020 showed TSH 3.46 on Synthroid 100 mcg  Labs on 03-16-2021 showed TSH 3.05 on Synthroid 100 mcg  Labs on 07-04-2021 showed TSH 3.05 on Synthroid 100 mcg  Labs on 01-07-2022 showed TSH 0.81 on Synthroid 100 mcg  Labs on 11/18/2022 showed TSH 0.97 on Synthroid 100 mcg  Labs on 01/31/2023 showed TSH 1.04 on Synthroid 100 mcg  Labs on 05/25/2023 showed TSH 1.70 on Synthroid 100 mcg  Labs on 08/26/2023 showed TSH 1.29 on Synthroid 100 mcg  12/16/2023 showed TSH 1.10 on Synthroid 100 mcg  04/08/2024 showed TSH 1.15 on Synthroid 100 mcg    TIA  Occurred 06-2020  On aspirin and Zetia    Gout  No flares since last visit  Labs on 09-06-2019 showed uric acid 5.8  Labs on 06-08-2020 showed uric acid 5.6  Labs on 12-15-2020 showed uric acid 5.8  Labs on 03-16-2021 showed uric acid 5.0  Labs on 07-04-2021 showed uric acid 4.7  Labs on 10/31/2022 showed uric acid 5.7  Labs on 01/31/2023 showed uric acid 3.9  12/16/2023 showed uric acid 3.9  04/08/2024 showed uric acid 4.2    Chronic nausea  On Zofran    Osteoarthritis/Low Back Pain/Sciatica  Known Disc Degeneration, had a spinal block about 6 years ago. Dr Laurinda.  Previously on hydrocodone  from oncology for pain associated with multiple myeloma.  Has since been weaned off  Currently on Flexeril   Resumed course of physical therapy  09-2019 on codeine from Dr Herby  12-2019 Going to Dr Alvan in [facility] for consultation on 27th. He had an initial  interview with NP last month. Suspects will have surgery  01-27-2020 Had surgery with Dr Alvan. Doing well. Is having physical therapy with Dr Verdie.  [phone] Knee pain has improved. Still having popping and cracking.  [phone] Saw Neurosurgery. They are planning on referring for injections  04-2023 had injection  05-2023 Plans on repeating in a few months. Switched to Lyrica  07-2023 States he does not want further surgery  04-2024 completed spinal ablations. Improved everything except for sciatica. Was released on follow up.     Asthma  Following with pulmonology    Edema  On lasix     Peripheral vascular disease  Doing well with pentoxifylline    GERD  Doing well with Pepcid    Chronic kidney disease stage II-III  Labs on 09-06-2019 showed BUN 37, creatinine 1.5, GFR 51  Labs on 12-13-2019 showed BUN 23, creatinine 1.3, GFR greater than 60  Labs on 06-10-2020 showed BUN 20, creatinine 1.4, GFR 55  Labs on 12-15-2020 showed BUN 28, creatinine 1.3, GFR greater than 60  Labs on 03-16-2021 showed BUN 28, creatinine 1.3, GFR greater than 60  Labs on 09-10-2021 showed BUN 31, creatinine 1.5, GFR 51  Labs on 01-07-2022 showed BUN 24, creatinine 1.2, GFR greater than 60  Labs on 06/26/2022 showed BUN 32, creatinine 1.47, GFR 54  Labs on 10/31/2022 showed BUN 27, creatinine 1.26, GFR 65  Labs on 01/31/2023 showed BUN 22, creatinine 1.38, GFR 58  Labs on 08/26/2023 showed BUN 29, creatinine 1.59, GFR 49  12/16/2023 showed BUN 25, creatinine 1.27, GFR 64  04/08/2024 showed BUN 33, creatinine 1.90, GFR 39  07/08/2024 showed BUN 28, creatinine 1.55, GFR 50    ROS:  10 systems reviewed and were negative except as noted.   + edema  + nausea  + abnormal gait, back pain, joint pain, muscle cramps, muscle weakness, numbness, tingling    Objective :  BP 135/75 (Site: Right Arm, Patient Position: Sitting, Cuff Size: Adult)   Pulse 68   Resp 16   Ht 1.803 m (5' 11)   Wt 97.6 kg (215 lb 3.2 oz)   SpO2 98%   BMI 30.01 kg/m        General:  appears chronically ill and moderately obese  Lungs:  Clear to auscultation bilaterally.   Cardiovascular:  regular rate and rhythm  Neurologic:  Grossly normal  Psychiatric:  Normal    Data reviewed:  Heme/Onc note - no change  Heme-Onc labs - electrophoresis still pending    Assessment/Plan:  Assessment/Plan   1. Benign hypertension    2. Mixed hyperlipidemia    3. Type 2 diabetes mellitus without complication, without long-term current use of insulin    4. Type 2 diabetes mellitus with stage 3a chronic kidney disease, without long-term current use of insulin (CMS HCC)    5. Peripheral vascular disease (CMS HCC)    6. Chronic diastolic CHF (congestive heart failure) (CMS HCC)    7. Mild intermittent asthma without complication    8. 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 Gastroesophageal reflux disease without esophagitis    10. Chronic nausea    11. Acquired hypothyroidism    12. 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)    13. Primary osteoarthritis involving multiple joints    14. Chronic bilateral low back pain with bilateral sciatica    15. Bisphosphonate-associated osteonecrosis  of the jaw (CMS HCC)    16. Chronic gout due to renal impairment involving foot without tophus, unspecified laterality    17. Status post autologous bone marrow transplantation (CMS HCC)    18. Localized edema    19. Statin intolerance    20. Need for hepatitis C screening test    21. Screening for HIV (human immunodeficiency virus)      Diabetes  Monitor  Continue glipizide   He does not like diabetic shoes. Actually caused more foot pain.     Hypertension  Continue diuretics  Follow with nephrology/cardiology    Multiple myeloma  In remission  Follow with oncology    Mixed hyperlipidemia  Monitor  Will see if any improvement with weight loss  No other medication options except for PSK9 inhibitor but cardiology does not want him to receive    Hypothyroidism  Continue Synthroid    Nausea  Continue  Zofran    Arthritis/back pain/sciatica  Continue Flexeril   Follow with Neurosurgery    Gout  Continue allopurinol     Asthma  Continue follow-up with pulmonology    Chronic edema  Continue diuretics    GERD  Continue Pepcid    Chronic disease stage III  Monitor    TIA  Follow with neurology    Restless leg  Monitor    Camellia Glendia Broaden, DO, [facility]  Internal Medicine

## 2024-07-09 NOTE — Nursing Note (Signed)
 Patient is here for his follow up. Patient reports he has a lot of nausea now since starting carvedilol.

## 2024-07-15 ENCOUNTER — Ambulatory Visit (INDEPENDENT_AMBULATORY_CARE_PROVIDER_SITE_OTHER): Payer: Self-pay | Admitting: HEMATOLOGY-ONCOLOGY

## 2024-07-28 ENCOUNTER — Other Ambulatory Visit (INDEPENDENT_AMBULATORY_CARE_PROVIDER_SITE_OTHER): Payer: Self-pay

## 2024-08-11 ENCOUNTER — Other Ambulatory Visit (RURAL_HEALTH_CENTER): Payer: Self-pay | Admitting: INTERNAL MEDICINE

## 2024-09-10 ENCOUNTER — Other Ambulatory Visit (RURAL_HEALTH_CENTER): Payer: Self-pay | Admitting: INTERNAL MEDICINE

## 2024-09-20 ENCOUNTER — Other Ambulatory Visit (INDEPENDENT_AMBULATORY_CARE_PROVIDER_SITE_OTHER): Payer: Self-pay | Admitting: INTERNAL MEDICINE

## 2024-10-04 ENCOUNTER — Other Ambulatory Visit (INDEPENDENT_AMBULATORY_CARE_PROVIDER_SITE_OTHER): Payer: Self-pay | Admitting: NURSE PRACTITIONER

## 2024-10-04 DIAGNOSIS — C9000 Multiple myeloma not having achieved remission: Secondary | ICD-10-CM

## 2024-10-04 NOTE — Progress Notes (Signed)
 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 male who was diagnosed with multiple myeloma in January 2004 after he presented to the emergency department with respiratory infection and was found to be in acute renal failure with a creatinine of 4.1.   A serum protein electrophoresis demonstrated 0.1 gram monoclonal spike.   Urine protein elctrophoresis revealed a monoclonal free lambda light chain with a protein of 3.1 g/24-hours.   Bone marrow biopsy was consistent with plasma cell neoplasm with normal cytogenetics.   Skeletal survey was negative for lytic lesions.   Serum calcium was 9.4 mg/dl  LDH 877  beta-2 microglobulin elevated at 6.   Quantitative immunoglobulins abnormal with IgG 365, IgA 14 and IgM 1    TREATMENT HISTORY:   VAD x 4 cycles  Autologous stem cell transplant  Stem cell mobilization using Cytoxan 1.5 gm/m2 every 3 hours x 3 doses  Preparative regiment - Melphalan (100 mg/m2 x 2 days)   Day 0 = 07/12/2003    POST-TRANSPLANT TREATMENT:  09/19/2003: Day +60 post transplant bone marrow biopsy and aspirate showed normal cellular marrow with no evidence for multiple myeloma. No M-spike was seen in the peripheral blood but a 24-hour urine for protein was not done. He was placed on thalidomide 50 mg daily.   01/04/2008: Underwent renal duplex which showed bilateral renal artery stenosis with restrictive index of 0.6 on the right, 0.65 on the left. He underwent exploratory laparotomy with bilateral renal vein  aortorenal bypass with reverse saphenous vein for severe hypertension, renal insufficiency associated with ACE inhibitors on 02/23/2008.  09/2009: Discontinued thalidomide maintenance and Aredia.  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  09/2015: Restarted lenalidomide 15 mg D1-21 of each 28-day cycle due to slowly rising FLC ratio.  01/2016: Lenalidomide held due to poor tolerance - vomiting, hypokalemia, thrombocytopenia, weight loss.  03/11/2016: Restarted lenalidomide 5 mg D1-21 of each 28-day cycle.  09/16/2016: Discontinued lenalidomide due to continued poor tolerance.  02/10/2017: Transitioned to ixazomib 2.3 mg D1/8/15 of each 28-day cycle.  12/29/2019: Underwent restaging bone marrow biopsy for consideration of discontinuation of maintenance. Marrow showed 5-10% polytypic plasma cells, normal male karyotype, FISH myeloma panel negative, MRD negative (0.0000%) by flow to [facility].  01/06/2020: Restaging PET/CT scan (OSH) with left mandibular lesions consistent with history of ONJ, focal area of soft tissue uptake of left shoulder likely associated with bursitis, no other osseous lesions.    INTERVAL HISTORY:   Connor Patterson returns to clinic with his wife for continued follow-up of his free lambda myeloma, currently on surveillance. He reports he is doing fair overall. He continues to experience low back pain with left lower extremity radiculopathy and intermittent weakness of his bilateral lower extremities. He underwent bilateral lumbar medial branch block RFA procedures by Dr. Bettey Grieve on 02/05/2024 and 02/25/2024. He reports he experienced some pain relief for approximately 4 months, but since symptoms have recurred. He reports  he takes 4-5 ibuprofen in the AM and PM. He reports taking pregabalin (Lyrica) 75 mg twice daily. He reports he tried Tylenol  in the past, but is currently not taking it. He reports he does not want to be prescribed pain medications unless absolutely  necessary. 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 He reports ongoing bilateral lower extremity edema that is somewhat adequately managed with spironolactone daily and furosemide  (Lasix ) as needed (L > R). He reports mild nausea in the AM, which resolves with famotidine (Pepcid). He is inquiring about possible diagnosis of Charcot-Marie Tooth disease and the possibility of related symptoms. He denies fevers/chills, night sweats, headaches, dizziness, shortness of breath, vomiting, diarrhea, new-onset bone pains, or arthralgias. He reports he follows up with his PCP next week, which he will discuss assistive device, like an electric scooter, for long distances and outdoor events. He denies additional questions or concerns.     REVIEW OF SYSTEMS:  A full 12 system ROS was obtained and was negative unless specified above.      ECOG/Zubrod Performance Status: 1 - Strenous physical activity restricted; fully ambulatory and able to carry out light work KPS (80-70%)    PAIN SCORE: 0-Zero    PAST MEDICAL HISTORY:    Active Ambulatory Problems     Diagnosis Date Noted   . Multiple myeloma (HCC) 01/21/2012   . 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 Low back pain 11/01/2019   . Spinal stenosis of lumbar region with neurogenic claudication 12/29/2019   . S/P lumbar laminectomy 12/29/2019   . 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 Essential hypertension      Resolved Ambulatory Problems     Diagnosis Date Noted   . No Resolved Ambulatory Problems     Past Medical History:   Diagnosis Date   . Arthritis    . Asthma    . Renal artery stenosis    . Thyroid  disease      PAST SURGICAL HISTORY:   Past Surgical History:   Procedure Laterality Date   . ABDOMINAL SURGERY       Procedure: ABDOMINAL SURGERY; Renal Artery Stenosis (blockage in  kidney)   . BACK SURGERY  2011    Procedure: BACK SURGERY; L4-5    . HERNIA REPAIR      Procedure: HERNIA REPAIR; Umbilical Hernia Repair w/ Mesh   . LUMBAR LAMINECTOMY Bilateral 01/27/2020    Procedure: LUMBAR LAMINECTOMY MULTI LEVEL  L2-5, Lumbar reexploration L3-L4, Decompression L2-L3, L4-L5;  Surgeon: Carlin Duos Alvan Raddle., MD;  Location: [facility] MAIN OR;  Service: Neurosurgery;  Laterality: Bilateral;   . OTHER SURGICAL HISTORY      Procedure: OTHER SURGICAL HISTORY (PERIPHERAL  STEM CELL TRANSPLANT)   . ROOT CANAL      Procedure: ROOT CANAL     ALLERGIES:  No Known Allergies    MEDICATIONS:  Current Outpatient Medications   Medication Instructions   . albuterol 2.5 mg /3 mL (0.083 %) nebulizer solution 1 ampule, Every 6 hours PRN   . allopurinoL  (ZYLOPRIM ) 300 mg, Daily   . ascorbic acid (VITAMIN C) 500 mg, Daily   . aspirin (ECOTRIN) 325 mg, Daily   . baclofen  (LIORESAL ) 10 mg, 3 times daily   . carvediloL (COREG) 3.125 mg, 2 times daily   .  cyclobenzaprine  (FLEXERIL ) 5 mg, 3 times daily PRN   . fluticasone furoate-vilanteroL (Breo Ellipta ) 100-25 mcg/dose dsdv inhaler 1 puff, Daily PRN   . furosemide  (LASIX ) 20 mg, Daily PRN   . ibuprofen (MOTRIN) 200 mg, Every 6 hours PRN   . ipratropium (ATROVENT ) 42 mcg (0.06 %) spry nasal spray 2 sprays   . ketoconazole (NIZORAL) 2 % cream    . L.acid-L.casei-B.bif-B.lon-FOS (Probiotic Blend) 2 billion cell-50 mg cap 1 capsule, Daily PRN   . levothyroxine  (SYNTHROID) 100 mcg, Nightly   . magnesium oxide 400 mg, Daily   . mometasone (Nasonex) 50 mcg/actuation spry spray 1 spray, Daily PRN   . ondansetron (ZOFRAN) 8 mg, 3 times daily PRN   . pentoxifylline (TRENTAL) 400 mg, oral, Daily - Transplant   . polyethylene glycol (GLYCOLAX) 17 g, oral, Daily PRN   . pregabalin (LYRICA) 75 mg, oral, 4 times daily   . Rybelsus  3 mg, Daily   . spironolactone (ALDACTONE) 100 mg, 2 times daily   . sulfamethoxazole-trimethoprim (BACTRIM DS) 800-160 mg per tablet 1 tablet, oral, Daily   .  tolnaftate  (TINACTIN) 1 % cream Apply  topically.   . triamcinolone  (KENALOG ) 0.1 % cream 1 Application, 2 times daily   . Vitamin B-12 1,000 mcg, Daily   . vitamin E 400 Units, oral, 2 times daily     PHYSICAL EXAMINATION:  VITALS: BP 150/69 (BP Location: Right arm, Patient Position: Sitting)   Pulse 72   Temp 97.3 F (36.3 C) (Temporal)   Resp 17   Ht 1.803 m (5' 11)   Wt 99.8 kg (220 lb)   SpO2 99%   BMI 30.68 kg/m    GENERAL: Sitting on exam table in no acute distress.  SKIN: Skin warm and dry with good turgor.   HEENT: Normalocephalic, atraumatic. Sclera white, conjunctivae pink. PERRLA (pupils equal, round, reactive to light and near accommodation). EOMs intact. External nose symmetric and without lesions.  Lips without lesions. Small, non-drainage fistula noted of left lateral lower cheek. Oral mucosa pink and moist without lesions. Dentition fair.  CARDIOVASCULAR: Chest wall without deformity. No heaves, lifts, or thrills. Heart regular rate and rhythm. No murmurs, gallops, or rubs. Normal S1 and S2. No S3 or S4.  RESPIRATORY: Thorax symmetric with good expansion. Clear to auscultation bilaterally; no rales, rhonchi, or wheezes.   PV: Upper and lower extremities pink and warm to touch, non-tender to palpation. No cyanosis appreciated. 2+ pitting edema of BLEs, tapering to trace pitting edema by level of knees.  NEURO: Mental Status: Alert, relaxed, and cooperative. Thought process coherent. Oriented to person, place, and time. Detailed cognitive testing deferred. No focal weakness.  MSK: Full range of motion in all joints of the bilateral upper and lower extremities. No evidence of swelling or deformity. No tenderness to palpation of cervical, thoracic, or lumbar spine.    LABORATORY STUDIES:  Lab on 10/04/2024   Component Date Value Ref Range Status   . Immunoglobulin A, Quantitative (Ig* 10/04/2024 274  66 - 433 mg/dL Final   . Immunoglobulin G, Quantitative (Ig* 10/04/2024 829  635 - 1,741 mg/dL  Final   . Immunoglobulin M, Quantitative (Ig* 10/04/2024 72  45 - 281 mg/dL Final   . Sodium 88/96/7974 135 (L)  136 - 145 mmol/L Final   . Potassium 10/04/2024 4.2  3.4 - 4.5 mmol/L Final    NO VISIBLE HEMOLYSIS   . Chloride 10/04/2024 104  98 - 107 mmol/L Final   . CO2 10/04/2024 24  21 - 31 mmol/L Final    High lactate dehydrogenase (LDH) concentrations in patient samples may cause falsely increased bicarbonate results. If markedly elevated LDH levels are suspected, please assess results in conjunction with patient's LDH values.   . Anion Gap 10/04/2024 7  6 - 14 mmol/L Final   . Glucose, Random 10/04/2024 122 (H)  70 - 99 mg/dL Final   . Blood Urea Nitrogen (BUN) 10/04/2024 34 (H)  7 - 25 mg/dL Final   . Creatinine 88/96/7974 1.64 (H)  0.70 - 1.30 mg/dL Final   . eGFR 88/96/7974 47 (L)  >59 mL/min/1.44m2 Final    GFR estimated by CKD-EPI equations(NKF 2021).     Recommend confirmation of Cr-based eGFR by using Cys-based eGFR and other filtration markers (if applicable) in complex cases and clinical decision-making, as needed.   . Albumin 10/04/2024 4.0  3.5 - 5.7 g/dL Final   . Total Protein 10/04/2024 6.8  6.4 - 8.9 g/dL Final   . Bilirubin, Total 10/04/2024 0.3  0.3 - 1.0 mg/dL Final   . Alkaline Phosphatase (ALP) 10/04/2024 105 (H)  34 - 104 U/L Final   . Aspartate Aminotransferase (AST) 10/04/2024 15  13 - 39 U/L Final   . Alanine Aminotransferase (ALT) 10/04/2024 16  7 - 52 U/L Final   . Calcium 10/04/2024 9.2  8.6 - 10.3 mg/dL Final   . BUN/Creatinine Ratio 10/04/2024 20.7 (H)  10.0 - 20.0 Final    Potential prerenal damage (e.g. CHF, GI Bleeding)   . WBC 10/04/2024 8.10  4.40 - 11.00 10*3/uL Final   . RBC 10/04/2024 3.85 (L)  4.50 - 5.90 10*6/uL Final   . Hemoglobin 10/04/2024 13.6 (L)  14.0 - 17.5 g/dL Final   . Hematocrit 88/96/7974 39.5 (L)  41.5 - 50.4 % Final   . Mean Corpuscular Volume (MCV) 10/04/2024 102.6 (H)  80.0 - 96.0 fL Final   . Mean Corpuscular Hemoglobin (MCH) 10/04/2024 35.3 (H)  27.5  - 33.2 pg Final   . Mean Corpuscular Hemoglobin Conc (* 10/04/2024 34.4  33.0 - 37.0 g/dL Final   . Red Cell Distribution Width (RDW) 10/04/2024 14.5  12.3 - 17.0 % Final   . Platelet Count (PLT) 10/04/2024 180  150 - 450 10*3/uL Final   . Mean Platelet Volume (MPV) 10/04/2024 8.1  6.8 - 10.2 fL Final   . Neutrophils % 10/04/2024 60  % Final   . Lymphocytes % 10/04/2024 27  % Final   . Monocytes % 10/04/2024 11  % Final   . Eosinophils % 10/04/2024 1  % Final   . Basophils % 10/04/2024 1  % Final   . nRBC % 10/04/2024 0  % Final   . Neutrophils Absolute 10/04/2024 4.80  1.80 - 7.80 10*3/uL Final   . Lymphocytes # 10/04/2024 2.20  1.00 - 4.80 10*3/uL Final   . Monocytes # 10/04/2024 0.90 (H)  0.00 - 0.80 10*3/uL Final   . Eosinophils # 10/04/2024 0.10  0.00 - 0.50 10*3/uL Final   . Basophils # 10/04/2024 0.10  0.00 - 0.20 10*3/uL Final   . nRBC Absolute 10/04/2024 0.00  <=0.00 10*3/uL Final     IMAGING RESULTS:   Osseous survey  (09/05/2014):  Scattered lucencies in the skull may reflect venous lakes, though difficult to exclude myeloma. No osseous lesions, otherwise, in the appendicular or axial skeleton.    PETCT Scan (skull base to mid-thigh, 01/06/2020): No significant abnormal radiotracer accumulation in neck, chest, abd, or pelvis. No significant dominant  or expansile lytic skeletal lesion is seen.    PATHOLOGY RESULTS:  Bone marrow biopsy (12/29/2019, [id number]): Normocellular marrow with approximately 5-10% plasma cells. CG with normal male karyotype. FISH myeloma panel negative. MRD negative (0.0000%) by flow to [facility].    ASSESMENT and PLAN:     Free lambda light chain multiple myeloma:   Bone marrow biopsy performed 12/2019 consistent with complete remission, MRD negative (0.0000%). PET/CT performed at OSH in 01/2020 with no evidence of active myeloma.  Continues to follow with local oncologist [facility]) and alternates visits here; seen at [facility] and seen at [facility].  Myeloma serology last performed 03/23/2024  consistent with continued remission with repeat labs pending today (11/03).   Continue surveillance at this time.  He will return to clinic in 6 month(s) and was encouraged to contact our clinic in the interim if he develops any questions or concerns.     Macrocytic anemia:  Hgb stable in 12-13 g/dL range, 86.3 g/dL today (88/95).  Rechecked vitamin B12 and folate levels 04/22, which were within normal limits.   Improving macrocytic anemia today (11/04), continue to monitor.    Chronic kidney disease, stage 3a:  Stable today, consistent with his recent baseline (1.2 - 1.7 mg/dL).  Discussed reducing intake of ibuprofen/naproxen and utilizing acetaminophen  (Tylenol ) in place of it. Patient informed and verbalized understanding.   Encouraged adequate hydration.   Continue to monitor.    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  Managed by Pain Medicine, most recently underwent L4-L5 and L5-S1 RFA of medial branch and primary dorsal ramus on 02/25/24.  Reports ongoing LLE radiculopathy, as well as intermittent weakness of BLEs. Plans to discuss symptoms with Pain Medicine; encouraged trial of Tylenol  500-625 mg BID-TID PRN instead of the frequency and dosage of ibuprofen he is currently taking. Educated patient about reducing ibuprofen frequency and dosage. Patient informed and verbalized understanding.     Additional Questions:   Discussed Charcot-Marie Tooth (CMT) disease with Dr. Fernande and it is less likely this patient has CMT. Dr. Fernande is familiar with the disease and this is an inherited disease, which is typically diagnosed during adolescence or early adulthood.     Supportive care:  Bisphosphonate therapy:  Contraindicated due to ONJ (last dose of Aredia in 2010).  Follows with ENT/oral  surgery annually; uses chlorhexidine rinses and periodic courses of antibiotics PRN.  IVIG:  Not currently indicated.     FOLLOW-UP:  RTC 6 months - labs, visit

## 2024-10-05 ENCOUNTER — Other Ambulatory Visit: Payer: Self-pay

## 2024-10-05 ENCOUNTER — Encounter (INDEPENDENT_AMBULATORY_CARE_PROVIDER_SITE_OTHER): Payer: Self-pay | Admitting: NURSE PRACTITIONER

## 2024-10-05 ENCOUNTER — Ambulatory Visit (INDEPENDENT_AMBULATORY_CARE_PROVIDER_SITE_OTHER)
Admission: RE | Admit: 2024-10-05 | Discharge: 2024-10-05 | Disposition: A | Discharge status: Home / Self Care | Payer: Self-pay | Source: Ambulatory Visit | Attending: NURSE PRACTITIONER | Admitting: NURSE PRACTITIONER

## 2024-10-05 ENCOUNTER — Ambulatory Visit: Payer: Self-pay | Attending: NURSE PRACTITIONER | Admitting: NURSE PRACTITIONER

## 2024-10-05 VITALS — BP 137/74 | HR 82 | Temp 96.9°F | Ht 71.0 in | Wt 222.7 lb

## 2024-10-05 DIAGNOSIS — M879 Osteonecrosis, unspecified: Secondary | ICD-10-CM | POA: Insufficient documentation

## 2024-10-05 DIAGNOSIS — Z9484 Stem cells transplant status: Secondary | ICD-10-CM | POA: Insufficient documentation

## 2024-10-05 DIAGNOSIS — Z9221 Personal history of antineoplastic chemotherapy: Secondary | ICD-10-CM | POA: Insufficient documentation

## 2024-10-05 DIAGNOSIS — C9000 Multiple myeloma not having achieved remission: Secondary | ICD-10-CM

## 2024-10-05 NOTE — Cancer Center Note (Signed)
 Department of Hematology/Oncology  Progress Note   Name: Connor Patterson  FMW:[id number]  Date of Birth: Apr 18, 1961  Encounter Date: 10/05/2024    REFERRING PROVIDER:  Wallie Camellia Hamilton, DO  [address]  [place],  [place] [address]     REASON FOR OFFICE VISIT:  Multiple Myeloma     HISTORY OF PRESENT ILLNESS:  Connor Patterson is a 63 y.o. male who presents today for follow up of multiple myeloma.  He was originally diagnosed in January 2004, and at that time had renal failure and a respiratory infection his serum electrophoresis showed a monoclonal protein of 0.1 grams/deciliter.  His bone marrow biopsy at that time was consistent with the presence of multiple myeloma, and a skeletal survey showed numerous lytic lesions and he also had an elevated calcium level.     He was being seen at [facility] and had VAD chemotherapy for 4 cycles.  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  He was treated with thalidomide in a maintenance fashion.  He developed osteonecrosis of the jaw in 2013, and his bisphosphonate therapy was discontinued at that time.  He was apparently switch to Revlimid for maintenance treatment, and it was discontinued in 2017 due to poor tolerance.    Since 2018, the patient has been off of any maintenance therapy.  His monoclonal protein levels have been very low and he has been considered to still be in remission.  He is being followed with observation and continues to follow up at [facility].    10/05/24: He followed up with [facility] on 10/04/24 and is scheduled to return for follow up in 6 months. He states that he is concerned that he may have a condition called Charcot-Marie-Tooth disease (CMT) and has discussed this with [facility]. Reading the note from [facility] they stated that they Discussed Charcot-Marie Tooth (CMT) disease with Dr. Fernande and it is less likely this patient has CMT. Dr. Fernande is familiar with the disease and this is an inherited disease, which is typically diagnosed during  adolescence or early adulthood. 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 Patient states that he is interested in a motorized scooter to assist with his mobility issues. We discussed options such as reaching out to the company of choice to evaluate if insurance helps to pay any and/or if other options are available such as payment plans, etc.     ROS: ...  Past Medical History:   Diagnosis Date    Asthma     Diabetes mellitus, type 2     Essential hypertension     Hx of transfusion        Past Surgical History:   Procedure Laterality Date    HX BACK SURGERY      3 times    HX COLONOSCOPY      LIMBAL STEM CELL TRANSPLANT      RENAL BIOPSY, OPEN         Social History     Socioeconomic History    Marital status: Married     Spouse name: Sherrilyn    Number of children: 2    Years of education: Not on file    Highest education level: Not on file   Occupational History    Not on file   Tobacco Use    Smoking status: Never    Smokeless tobacco: Former   [employer]vertising [employer]  Vaping status: Never Used   Substance and Sexual Activity    Alcohol use: Never    Drug use: Never    Sexual activity: Not on file   Other Topics Concern    Not on file   Social History Narrative    Not on file     Social Determinants of Health     Financial Resource Strain: Not on file   Transportation Needs: No Transportation Needs (03/23/2024)    Received from [facility]    Transportation     In the past 12 months, has lack of reliable transportation kept you from medical appointments, meetings, work or from getting things needed for daily living? : No   Social Connections: Not on file   Intimate Partner Violence: Not on file   Housing Stability: Low Risk  (03/23/2024)    Received from [facility]    Housing Stability Vital Sign     What is your living situation today?: I have a steady place to live     Think about the place you live. Do you have problems with any of the following?  Choose all that apply:: None/None on this list     Family Medical History:       Problem Relation (Age of Onset)    Autoimmune disease Mother    Carotid Stenosis Mother    Hodgkin's lymphoma Father            Current Outpatient Medications   Medication Sig    allopurinoL  (ZYLOPRIM ) 300 mg Oral Tablet Take 1 Tablet (300 mg total) by mouth Daily    ascorbic acid, vitamin C, (VITAMIN C) 500 mg Oral Tablet Take 1 Tablet (500 mg total) by mouth    aspirin (ECOTRIN) 325 mg Oral Tablet, Delayed Release (E.C.) Take 1 Tablet (325 mg total) by mouth    B cmplx 4/vit D3/C/folic/zinc (VITAL-D RX ORAL) Take 1,000 mcg by mouth    baclofen  (LIORESAL ) 10 mg Oral Tablet TAKE 1 TABLET BY MOUTH THREE TIMES DAILY    BREO ELLIPTA  100-25 mcg/dose Inhalation Disk with Device Inhale 1 puff by mouth once daily    carvediloL (COREG) 3.125 mg Oral Tablet Take 1 Tablet (3.125 mg total) by mouth Twice daily    chlorhexidine gluconate (PERIDEX) 0.12 % Mucous Membrane Mouthwash 15 mL    cyanocobalamin (VITAMIN B 12) 1,000 mcg Oral Tablet Take 1 Tablet (1,000 mcg total) by mouth    famotidine (PEPCID) 20 mg Oral Tablet Take 1 Tablet (20 mg total) by mouth    furosemide  (LASIX ) 20 mg Oral Tablet Take 1 Tablet (20 mg total) by mouth Daily    ipratropium bromide  (ATROVENT ) 42 mcg (0.06 %) Nasal Spray, Non-Aerosol Administer 2 Sprays into affected nostril(s) Three times a day    levothyroxine  (SYNTHROID) 100 mcg Oral Tablet Take 1 tablet by mouth once daily    magnesium oxide (MAG-OX) 400 mg Oral Tablet Take 1 Tablet (400 mg total) by mouth    pentoxifylline (TRENTAL) 400 mg Oral Tablet Sustained Release Take 1 Tablet (400 mg total) by mouth    pregabalin (LYRICA) 75 mg Oral Capsule Take 1 Capsule (75 mg total) by mouth Four times a day (Patient taking differently: Take 1 Capsule (75 mg total) by mouth Twice daily)    spironolactone (ALDACTONE) 100 mg Oral Tablet Take 1 Tablet (100 mg total) by mouth Twice daily    triamcinolone  acetonide 0.1 %  Ointment APPLY0.5 G OF OINTMENT TOPICALLY TO AFFECTED AREA TWICE DAILY  trimethoprim-sulfamethoxazole (BACTRIM DS) 160-800mg  per tablet Take 1 Tablet (160 mg total) by mouth Daily    vitamin E 400 unit Oral Capsule Take 1 Capsule (400 Units total) by mouth     No Known Allergies    PHYSICAL EXAM:  BP 137/74 (Site: Right Arm, Patient Position: Sitting, Cuff Size: Adult)   Pulse 82   Temp 36.1 C (96.9 F) (Temporal)   Ht 1.803 m (5' 11)   Wt 101 kg (222 lb 11.2 oz)   SpO2 97%   BMI 31.06 kg/m        ECOG Status: (0) Fully active, able to carry on all predisease performance without restriction   Physical Exam  Vitals reviewed.   Eyes:      Pupils: Pupils are equal, round, and reactive to light.   Cardiovascular:      Rate and Rhythm: Normal rate and regular rhythm.      Heart sounds: Normal heart sounds. No murmur heard.  Pulmonary:      Effort: Pulmonary effort is normal.   Musculoskeletal:         General: Normal range of motion.      Cervical back: Normal range of motion.   Lymphadenopathy:      Head:      Right side of head: No submental, submandibular, tonsillar, preauricular, posterior auricular or occipital adenopathy.      Left side of head: No submental, submandibular, tonsillar, preauricular, posterior auricular or occipital adenopathy.      Cervical: No cervical adenopathy.      Right cervical: No superficial, deep or posterior cervical adenopathy.     Left cervical: No superficial, deep or posterior cervical adenopathy.      Upper Body:      Right upper body: No supraclavicular or axillary adenopathy.      Left upper body: No supraclavicular or axillary adenopathy.      Lower Body: No right inguinal adenopathy. No left inguinal adenopathy.   Skin:     General: Skin is warm and dry.      Capillary Refill: Capillary refill takes less than 2 seconds.   Neurological:      General: No focal deficit present.      Mental Status: He is alert and oriented to person, place, and time. Mental status is at  baseline.      Motor: Motor function is intact.      Coordination: Coordination is intact.      Gait: Gait is intact.   Psychiatric:         Attention and Perception: Attention normal.         Mood and Affect: Mood and affect normal.         Speech: Speech normal.         Behavior: Behavior normal.         Thought Content: Thought content normal.         Cognition and Memory: Cognition normal.         Judgment: Judgment normal.         LABS:   CBC  Diff   Lab Results   Component Value Date/Time    WBC 7.8 07/08/2024 10:23 AM    HGB 14.8 07/08/2024 10:23 AM    HCT 42.6 07/08/2024 10:23 AM    PLTCNT 232 07/08/2024 10:23 AM    RBC 4.24 07/08/2024 10:23 AM    MCV 100.5 (H) 07/08/2024 10:23 AM    MCHC 34.8 07/08/2024 10:23 AM  MCH 35.0 (H) 07/08/2024 10:23 AM    RDW 14.8 07/08/2024 10:23 AM    MPV 8.3 07/08/2024 10:23 AM    Lab Results   Component Value Date/Time    PMNS 67 07/08/2024 10:23 AM    LYMPHOCYTES 22 07/08/2024 10:23 AM    EOSINOPHIL 1 07/08/2024 10:23 AM    MONOCYTES 9 07/08/2024 10:23 AM    BASOPHILS 1 07/08/2024 10:23 AM    BASOPHILS 0.00 07/08/2024 10:23 AM    PMNABS 5.20 07/08/2024 10:23 AM    LYMPHSABS 1.80 07/08/2024 10:23 AM    EOSABS 0.10 07/08/2024 10:23 AM    MONOSABS 0.70 07/08/2024 10:23 AM            Comprehensive Metabolic Profile    Lab Results   Component Value Date    SODIUM 135 (L) 07/08/2024    POTASSIUM 4.8 07/08/2024    CHLORIDE 105 07/08/2024    CO2 23 07/08/2024    ANIONGAP 7 07/08/2024    BUN 28 (H) 07/08/2024    CREATININE 1.55 (H) 07/08/2024    ALBUMIN 4.0 07/08/2024    ALBUMIN 3.5 07/08/2024    CALCIUM 10.3 07/08/2024    GLUCOSENF 144 (H) 07/08/2024    ALKPHOS 109 (H) 07/08/2024    ALT 21 07/08/2024    AST 19 07/08/2024    TOTBILIRUBIN 0.3 07/08/2024    TOTALPROTEIN 7.3 07/08/2024    TOTALPROTEIN 6.7 07/08/2024         ESTIMATED GFR   Date Value Ref Range Status   07/08/2024 50 (L) >59 mL/min/1.39m^2 Final        SLC, SIEP  Lab Results   Component Value Date    KAPPA FREE LIGHT  CHAINS 3.23 07/08/2024    KAPPA FREE LIGHT CHAINS 3.23 07/08/2024    LAMBDA FREE LIGHT CHAINS 3.31 (H) 07/08/2024    LAMBDA FREE LIGHT CHAINS 3.31 (H) 07/08/2024    KAPPA/LAMBDA FLC RATIO 0.98 07/08/2024    KAPPA/LAMBDA FLC RATIO 0.98 07/08/2024      ASSESSMENT:      ICD-10-CM    1. Multiple myeloma, remission status unspecified (CMS HCC)  C90.00           PLAN:   1. Multiple myeloma:  The patient is status post VAD induction, stem cell transplant, thalidomide maintenance followed by lenalidomide maintenance.  He has been on observation since 2018 and has not had any evidence of recurrence since that time.  We are basically following him as an [facility] patient, and we will recheck laboratory studies every 6 months.  He will have myeloma specific lab studies completed today as ordered.   2. Osteonecrosis of the jaw:  No further bisphosphonate therapy is planned.    Elsie CHRISTELLA Gilmore was given the chance to ask questions, and these were answered to their satisfaction. The patient is welcome to call with any questions or concerns in the meantime.       On the day of the encounter, a total of 40 minutes was spent on this patient encounter including review of historical information, examination, documentation and post-visit activities.   Return in about 3 months (around 01/05/2025).   Eleanor Sers, APRN,FNP-BC ,10/05/2024 ,12:00     The patient's insurance company bears full legal and financial responsibility resulting from any deviations that they cause to my recommended treatment plan.   CC:  Camellia Hamilton [place], DO  [address]  [place] [address]    [place], Camellia Hamilton, DO  [address]  [place],  [place] [address]    This note was partially generated using MModal Fluency Direct system, and there may be some incorrect words, spellings, and punctuation that were not noted in checking the note before saving.

## 2024-10-11 ENCOUNTER — Ambulatory Visit: Payer: Self-pay | Attending: INTERNAL MEDICINE | Admitting: INTERNAL MEDICINE

## 2024-10-11 ENCOUNTER — Other Ambulatory Visit: Payer: Self-pay

## 2024-10-11 ENCOUNTER — Encounter (INDEPENDENT_AMBULATORY_CARE_PROVIDER_SITE_OTHER): Payer: Self-pay | Admitting: INTERNAL MEDICINE

## 2024-10-11 VITALS — BP 139/77 | HR 88 | Resp 18 | Ht 71.0 in | Wt 220.0 lb

## 2024-10-11 DIAGNOSIS — E039 Hypothyroidism, unspecified: Secondary | ICD-10-CM | POA: Insufficient documentation

## 2024-10-11 DIAGNOSIS — C9001 Multiple myeloma in remission: Secondary | ICD-10-CM | POA: Insufficient documentation

## 2024-10-11 DIAGNOSIS — J4520 Mild intermittent asthma, uncomplicated: Secondary | ICD-10-CM | POA: Insufficient documentation

## 2024-10-11 DIAGNOSIS — M150 Primary generalized (osteo)arthritis: Secondary | ICD-10-CM | POA: Insufficient documentation

## 2024-10-11 DIAGNOSIS — M87180 Osteonecrosis due to drugs, jaw: Secondary | ICD-10-CM | POA: Insufficient documentation

## 2024-10-11 DIAGNOSIS — I739 Peripheral vascular disease, unspecified: Secondary | ICD-10-CM | POA: Insufficient documentation

## 2024-10-11 DIAGNOSIS — Z9484 Stem cells transplant status: Secondary | ICD-10-CM | POA: Insufficient documentation

## 2024-10-11 DIAGNOSIS — N1831 Chronic kidney disease, stage 3a: Secondary | ICD-10-CM | POA: Insufficient documentation

## 2024-10-11 DIAGNOSIS — E782 Mixed hyperlipidemia: Secondary | ICD-10-CM | POA: Insufficient documentation

## 2024-10-11 DIAGNOSIS — I5032 Chronic diastolic (congestive) heart failure: Secondary | ICD-10-CM | POA: Insufficient documentation

## 2024-10-11 DIAGNOSIS — I10 Essential (primary) hypertension: Secondary | ICD-10-CM | POA: Insufficient documentation

## 2024-10-11 DIAGNOSIS — Z114 Encounter for screening for human immunodeficiency virus [HIV]: Secondary | ICD-10-CM | POA: Insufficient documentation

## 2024-10-11 DIAGNOSIS — M1A3790 Chronic gout due to renal impairment, unspecified ankle and foot, without tophus (tophi): Secondary | ICD-10-CM | POA: Insufficient documentation

## 2024-10-11 DIAGNOSIS — M5441 Lumbago with sciatica, right side: Secondary | ICD-10-CM | POA: Insufficient documentation

## 2024-10-11 DIAGNOSIS — E1122 Type 2 diabetes mellitus with diabetic chronic kidney disease: Secondary | ICD-10-CM | POA: Insufficient documentation

## 2024-10-11 DIAGNOSIS — G8929 Other chronic pain: Secondary | ICD-10-CM | POA: Insufficient documentation

## 2024-10-11 DIAGNOSIS — I701 Atherosclerosis of renal artery: Secondary | ICD-10-CM | POA: Insufficient documentation

## 2024-10-11 DIAGNOSIS — K219 Gastro-esophageal reflux disease without esophagitis: Secondary | ICD-10-CM | POA: Insufficient documentation

## 2024-10-11 DIAGNOSIS — Z1159 Encounter for screening for other viral diseases: Secondary | ICD-10-CM | POA: Insufficient documentation

## 2024-10-11 DIAGNOSIS — C9000 Multiple myeloma not having achieved remission: Secondary | ICD-10-CM | POA: Insufficient documentation

## 2024-10-11 DIAGNOSIS — T458X5A Adverse effect of other primarily systemic and hematological agents, initial encounter: Secondary | ICD-10-CM | POA: Insufficient documentation

## 2024-10-11 DIAGNOSIS — Z789 Other specified health status: Secondary | ICD-10-CM | POA: Insufficient documentation

## 2024-10-11 DIAGNOSIS — G629 Polyneuropathy, unspecified: Secondary | ICD-10-CM | POA: Insufficient documentation

## 2024-10-11 DIAGNOSIS — M5442 Lumbago with sciatica, left side: Secondary | ICD-10-CM | POA: Insufficient documentation

## 2024-10-11 DIAGNOSIS — Z9481 Bone marrow transplant status: Secondary | ICD-10-CM | POA: Insufficient documentation

## 2024-10-11 DIAGNOSIS — R110 Nausea: Secondary | ICD-10-CM | POA: Insufficient documentation

## 2024-10-11 DIAGNOSIS — E119 Type 2 diabetes mellitus without complications: Secondary | ICD-10-CM | POA: Insufficient documentation

## 2024-10-11 LAB — LIPID PANEL
CHOL/HDL RATIO: 6.2
CHOLESTEROL: 241 mg/dL — ABNORMAL HIGH (ref <200)
HDL CHOL: 39 mg/dL — ABNORMAL LOW (ref >=40)
LDL CALC: 173 mg/dL — ABNORMAL HIGH (ref 0–100)
TRIGLYCERIDES: 146 mg/dL (ref <=150)
VLDL CALC: 29 mg/dL (ref 0–50)

## 2024-10-11 LAB — MICROALBUMIN/CREATININE RATIO, URINE, RANDOM
CREATININE RANDOM URINE: 211 mg/dL — ABNORMAL HIGH (ref 30–125)
MICROALBUMIN RANDOM URINE: 0.8 mg/dL
MICROALBUMIN/CREATININE RATIO RANDOM URINE: 3.8 mg/g

## 2024-10-11 LAB — HGA1C (HEMOGLOBIN A1C WITH EST AVG GLUCOSE): HEMOGLOBIN A1C: 6.4 % — ABNORMAL HIGH (ref 4.0–6.0)

## 2024-10-11 MED ORDER — BREO ELLIPTA 100 MCG-25 MCG/DOSE POWDER FOR INHALATION
1.0000 | DISK | Freq: Every day | RESPIRATORY_TRACT | 1 refills | Status: AC
Start: 2024-10-11 — End: ?

## 2024-10-11 MED ORDER — LEVOTHYROXINE 100 MCG TABLET
100.0000 ug | ORAL_TABLET | Freq: Every day | ORAL | 1 refills | Status: AC
Start: 2024-10-11 — End: ?

## 2024-10-11 MED ORDER — ALLOPURINOL 300 MG TABLET
300.0000 mg | ORAL_TABLET | Freq: Every day | ORAL | 1 refills | Status: AC
Start: 2024-10-11 — End: ?

## 2024-10-11 MED ORDER — BACLOFEN 10 MG TABLET: 10.0000 mg | ORAL_TABLET | Freq: Three times a day (TID) | ORAL | 1 refills | Status: DC

## 2024-10-11 MED ORDER — SPIRONOLACTONE 100 MG TABLET
100.0000 mg | ORAL_TABLET | Freq: Two times a day (BID) | ORAL | 1 refills | Status: AC
Start: 2024-10-11 — End: 2025-04-09

## 2024-10-11 MED ORDER — IPRATROPIUM BROMIDE 42 MCG (0.06 %) NASAL SPRAY
2.0000 | Freq: Three times a day (TID) | NASAL | 0 refills | Status: AC
Start: 2024-10-11 — End: ?

## 2024-10-11 MED ORDER — FUROSEMIDE 20 MG TABLET
20.0000 mg | ORAL_TABLET | Freq: Every day | ORAL | 1 refills | Status: AC
Start: 2024-10-11 — End: 2025-04-09

## 2024-10-11 NOTE — Progress Notes (Signed)
 [facility] Internal Medicine  [address] [place], [place]  [address]  Phone: [phone] Fax: [phone]    Name: Connor Patterson                       Date of Birth: [birth year]   MRN:  [id number]                         Date of visit: 10/11/2024     Chief Complaint: Follow Up 3 Months (C/o 2 recent falls. )    Current Outpatient Medications   Medication Sig    acetaminophen  (TYLENOL ) 325 mg Oral Tablet Take 3 Tablets (975 mg total) by mouth Daily    allopurinoL  (ZYLOPRIM ) 300 mg Oral Tablet Take 1 Tablet (300 mg total) by mouth Daily    ascorbic acid, vitamin C, (VITAMIN C) 500 mg Oral Tablet Take 1 Tablet (500 mg total) by mouth    aspirin (ECOTRIN) 325 mg Oral Tablet, Delayed Release (E.C.) Take 1 Tablet (325 mg total) by mouth    B cmplx 4/vit D3/C/folic/zinc (VITAL-D RX ORAL) Take 1,000 mcg by mouth    baclofen  (LIORESAL ) 10 mg Oral Tablet Take 1 Tablet (10 mg total) by mouth Three times a day    BREO ELLIPTA  100-25 mcg/dose Inhalation Disk with Device Take 1 Inhalation by inhalation Daily    carvediloL (COREG) 3.125 mg Oral Tablet Take 1 Tablet (3.125 mg total) by mouth Twice daily    chlorhexidine gluconate (PERIDEX) 0.12 % Mucous Membrane Mouthwash 15 mL    cyanocobalamin (VITAMIN B 12) 1,000 mcg Oral Tablet Take 1 Tablet (1,000 mcg total) by mouth    famotidine (PEPCID) 20 mg Oral Tablet Take 1 Tablet (20 mg total) by mouth    furosemide  (LASIX ) 20 mg Oral Tablet Take 1 Tablet (20 mg total) by mouth Daily for 180 days    ibuprofen (MOTRIN) 400 mg Oral Tablet Take 1 Tablet (400 mg total) by mouth Every night    ipratropium bromide  (ATROVENT ) 42 mcg (0.06 %) Nasal Spray, Non-Aerosol Administer 2 Sprays into affected nostril(s) Three times a day    levothyroxine  (SYNTHROID) 100 mcg Oral Tablet Take 1 Tablet (100 mcg total) by mouth Daily    magnesium oxide (MAG-OX) 400 mg Oral Tablet Take 1 Tablet (400 mg total) by mouth    pentoxifylline (TRENTAL) 400 mg Oral Tablet Sustained Release Take 1 Tablet  (400 mg total) by mouth    pregabalin (LYRICA) 75 mg Oral Capsule Take 1 Capsule (75 mg total) by mouth Four times a day (Patient taking differently: Take 1 Capsule (75 mg total) by mouth Twice daily)    spironolactone  (ALDACTONE ) 100 mg Oral Tablet Take 1 Tablet (100 mg total) by mouth Twice daily    triamcinolone  acetonide 0.1 % Ointment APPLY0.5 G OF OINTMENT TOPICALLY TO AFFECTED AREA TWICE DAILY    trimethoprim-sulfamethoxazole (BACTRIM DS) 160-800mg  per tablet Take 1 Tablet (160 mg total) by mouth Daily    vitamin E 400 unit Oral Capsule Take 1 Capsule (400 Units total) by mouth       Patient Active Problem List    Diagnosis Date Noted    Type 2 diabetes mellitus with stage 3a chronic kidney disease, without long-term current use of insulin (CMS HCC) 04/08/2024    Chronic diastolic CHF (congestive heart failure) (CMS HCC) 04/08/2024    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    Status post autologous bone  marrow transplantation (CMS HCC) 01/31/2023    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    Benign hypertension 04/08/2022    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/08/2022    Type 2 diabetes mellitus without complication, without long-term current use of insulin 04/08/2022    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) 04/08/2022    Mixed hyperlipidemia 04/08/2022    Acquired hypothyroidism 04/08/2022    TIA (transient ischemic attack) 04/08/2022    Gout 04/08/2022    Chronic nausea 04/08/2022    Osteoarthritis 04/08/2022    Chronic low back pain 04/08/2022    Bilateral sciatica 04/08/2022    Mild intermittent asthma without complication 04/08/2022    Peripheral vascular disease (CMS HCC) 04/08/2022    Gastroesophageal reflux disease without esophagitis 04/08/2022    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    Patient had questions about Charcot Marie  tooth with Heme-Onc.  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 Not using walker or cane. Starts having issues with extended ambulation. Cannot walk through store. Mostly wants it for getting out and about.   Had a few months of relief with spinal injections at [facility], but hard to get back and forth.     No labs since last visit    Patient was also discussing possible electric scooter with Heme-Onc. He brought pictures of a specific one he wants.     Hypertension  W/ Hx of renal artery stenosis  Following with nephrology/cardiology    Diabetes mellitus type 2  Currently on glipizide   Eye exam Dr Shellia in March 2024. Everything was good per patient.   Did not tolerate DPP4.  Not a candidate for metformin. Did not tolerate SGLT2 2/2 side pain. GLPs caused nausea.   Labs on 02-04-2019 showed hemoglobin A1c was 10.2.  Glipizide  was added at that time  Labs on 05-24-2019 showed hemoglobin A1c 9.9  Patient resistant to starting insulin  09-2019 working on diet. has lost weight.  Labs on 09-06-2019 showed hemoglobin A1c 12.0, micro albumin creatnine ratio <30   * Suggested start Insulin, but patient only will accept oral agents  Labs on 12-13-2019 showed hemoglobin A1c 12.6, microalbumin creatinine ratio less than 30  03-2020 Samples of Jardiance given to patient. Tolerarated so increased to max oral dose.  06-2020 D/C Jardiance due to side pain  Labs on 06-08-2020 showed hemoglobin A1c 8.9   * Started Rybelsus   09-2020 Log 85-191  Labs on 12-15-2020 showed hemoglobin A1c 6.9  Labs on 03-16-2021 showed hemoglobin A1c 7.3  Labs on 09-10-2021 showed hemoglobin A1c 6.8  Labs on 01-07-2022 showed hemoglobin A1c 8.0  Labs on 06/26/2022 showed hemoglobin A1c 7.0, microalbumin creatinine ratio less than 30  Labs on 10/31/2022 showed hemoglobin A1c 6.5   Rybelsus  stopped secondary to nausea  Labs on 01/31/2023 showed hemoglobin A1c 6.8  Labs on 05/05/2023 showed hemoglobin A1c 6.8  Labs on 08/26/2023 showed hemoglobin A1c 6.5, microalbumin creatinine ratio less than 30  12/16/2023 showed hemoglobin A1c 6.5  04/08/2024 showed hemoglobin A1c 6.6    Multiple myeloma  Lambda light change, w/ renal disease  s/p autologous stem cell transplant, thalidomide and aredia chemotherapy  In Remission, was on Ninlaro maintenance  12-2020 Dr Herd has resumed care as they could not find anyone to cover. Patient will not need Ninlaro this year.  [phone] Saw Dr Herd. He is definitely retiring. Will be seeing Dr Mccade.  [phone] Saw Dr Julietta. No changes.  [phone] Saw Dr Julietta. No changes. Will see [facility] tomorrow.  01-2022 Bone Scan 1. Negative exam for fractures, lytic or blastic lesions in the skull, spine, ribs, pelvis, humeri and femora.  2. 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    Mixed hyperlipidemia  Does not tolerate statins except for Livalo.  Insurance will not cover Livalo  Have tried all available non-statin oral agents, and all of proven ineffective  With diabetes he does meet the criteria for the injectables, but Cardiology does not want him on them  Labs on 02-04-2019 showed total cholesterol 244, triglycerides 280, HDL 30, direct LDL 132  Labs on 05-24-2019 showed total cholesterol 273, triglycerides 701, HDL 25, direct LDL 117  Labs on 09-06-2019 showed total cholesterol 203, triglycerides 324, HDL 33, direct LDL 17  Labs on 12-13-2019 showed total cholesterol 212, triglycerides 217, HDL 32, direct LDL 144  Labs on 06-08-2020 showed total cholesterol 227, triglycerides 179, HDL 34, direct LDL 160  08-2020 Dr Sharron resume Zetia despite lack of efficacy in prior attempt  Labs on 12-15-2020 showed total cholesterol 179, triglycerides 125, HDL 38, direct LDL 114  Labs on 03-16-2021 showed total cholesterol 192, triglycerides  102, HDL 47, calculated LDL 125  Labs on 07-04-2021 showed total cholesterol 210, triglycerides 130, HDL 38, direct LDL 152  Labs on 01-07-2022 showed total cholesterol 230, triglycerides 206, HDL 37,   Labs on 06/26/2022 showed total cholesterol 228, triglycerides 271, HDL 34, direct LDL 170  Labs on 10/31/2022 showed total cholesterol 230, triglycerides 150, HDL 36, direct LDL 170  Labs on 01/31/2023 showed total cholesterol 186, triglycerides 117, HDL 37, direct LDL 143  Labs on 05/25/2023 showed total cholesterol 223, triglycerides 68, HDL 45, direct LDL 186  Labs on 08/06/2023 showed total cholesterol 219, triglycerides 94, HDL 46, direct LDL 176  12/16/2023 showed total cholesterol 214, triglycerides 60, HDL 44, direct LDL 174  04/08/2024 showed total cholesterol 233, triglycerides 170, HDL 40, direct LDL 186    Chronic diastolic CHF  Following with Dr Hart    Hypothyroidism  On Synthroid 100 mcg  Labs on 02-04-2019 showed TSH 1.58 on Synthroid 100 mcg  Labs on 05-24-2019 showed TSH 3.83 on Synthroid 100 mcg  Labs on 09-06-2019 showed TSH 0.90 on Synthroid 100 mcg  Labs on 12-13-2019 showed TSH 1.28 on Synthroid 100 mcg  Labs on 06-08-2020 showed TSH 1.53 on Synthroid 100 mcg  Labs on 12-15-2020 showed TSH 3.46 on Synthroid 100 mcg  Labs on 03-16-2021 showed TSH 3.05 on Synthroid 100 mcg  Labs on 07-04-2021 showed TSH 3.05 on Synthroid 100 mcg  Labs on 01-07-2022 showed TSH 0.81 on Synthroid 100 mcg  Labs on 11/18/2022 showed TSH 0.97 on Synthroid 100 mcg  Labs on 01/31/2023 showed TSH 1.04 on Synthroid 100 mcg  Labs on 05/25/2023 showed TSH 1.70 on Synthroid 100 mcg  Labs on 08/26/2023 showed TSH 1.29 on Synthroid 100 mcg  12/16/2023 showed TSH 1.10 on Synthroid 100 mcg  04/08/2024 showed TSH 1.15 on Synthroid 100 mcg    TIA  Occurred 06-2020  On aspirin and Zetia    Gout  No flares since last visit  Labs on  09-06-2019 showed uric acid 5.8  Labs on 06-08-2020 showed uric acid 5.6  Labs on 12-15-2020 showed  uric acid 5.8  Labs on 03-16-2021 showed uric acid 5.0  Labs on 07-04-2021 showed uric acid 4.7  Labs on 10/31/2022 showed uric acid 5.7  Labs on 01/31/2023 showed uric acid 3.9  12/16/2023 showed uric acid 3.9  04/08/2024 showed uric acid 4.2    Chronic nausea  On Zofran    Osteoarthritis/Low Back Pain/Sciatica  Known Disc Degeneration, had a spinal block about 6 years ago. Dr Laurinda.  Previously on hydrocodone  from oncology for pain associated with multiple myeloma.  Has since been weaned off  Currently on Flexeril   Resumed course of physical therapy  09-2019 on codeine from Dr Herby  12-2019 Going to Dr Alvan in [facility] for consultation on 27th. He had an initial interview with NP last month. Suspects will have surgery  01-27-2020 Had surgery with Dr Alvan. Doing well. Is having physical therapy with Dr Verdie.  [phone] Knee pain has improved. Still having popping and cracking.  [phone] Saw Neurosurgery. They are planning on referring for injections  04-2023 had injection  05-2023 Plans on repeating in a few months. Switched to Lyrica  07-2023 States he does not want further surgery  04-2024 completed spinal ablations. Improved everything except for sciatica. Was released on follow up.     Asthma  Following with pulmonology    Edema  On lasix     Peripheral vascular disease  Doing well with pentoxifylline    GERD  Doing well with Pepcid    Chronic kidney disease stage II-III  Labs on 09-06-2019 showed BUN 37, creatinine 1.5, GFR 51  Labs on 12-13-2019 showed BUN 23, creatinine 1.3, GFR greater than 60  Labs on 06-10-2020 showed BUN 20, creatinine 1.4, GFR 55  Labs on 12-15-2020 showed BUN 28, creatinine 1.3, GFR greater than 60  Labs on 03-16-2021 showed BUN 28, creatinine 1.3, GFR greater than 60  Labs on 09-10-2021 showed BUN 31, creatinine 1.5, GFR 51  Labs on 01-07-2022 showed BUN 24, creatinine 1.2, GFR greater than 60  Labs on 06/26/2022 showed BUN 32, creatinine 1.47, GFR 54  Labs on 10/31/2022  showed BUN 27, creatinine 1.26, GFR 65  Labs on 01/31/2023 showed BUN 22, creatinine 1.38, GFR 58  Labs on 08/26/2023 showed BUN 29, creatinine 1.59, GFR 49  12/16/2023 showed BUN 25, creatinine 1.27, GFR 64  04/08/2024 showed BUN 33, creatinine 1.90, GFR 39  07/08/2024 showed BUN 28, creatinine 1.55, GFR 50    Osteonecrosis of the jaw   No further bisphosphonates    ROS:  10 systems reviewed and were negative except as noted.   + edema  + nausea  + abnormal gait, back pain, joint pain, muscle cramps, muscle weakness, numbness, tingling    Objective :  BP 139/77 (Site: Left Arm, Patient Position: Sitting, Cuff Size: Adult)   Pulse 88   Resp 18   Ht 1.803 m (5' 11)   Wt 99.8 kg (220 lb)   SpO2 96%   BMI 30.68 kg/m       General:  appears chronically ill and moderately obese  Lungs:  Clear to auscultation bilaterally.   Cardiovascular:  regular rate and rhythm  Neurologic:  Grossly normal  Psychiatric:  Normal    Data reviewed:  Heme/Onc note at [facility] - no change  Heme-Onc note locally - no change    Assessment/Plan:  Assessment/Plan   1. Benign hypertension  2. Mixed hyperlipidemia    3. Type 2 diabetes mellitus without complication, without long-term current use of insulin    4. Acquired hypothyroidism    5. Type 2 diabetes mellitus with stage 3a chronic kidney disease, without long-term current use of insulin (CMS HCC)    6. Stage 3a chronic kidney disease (CMS HCC)    7. Peripheral vascular disease (CMS HCC)    8. Chronic diastolic CHF (congestive heart failure) (CMS HCC)    9. Renal artery stenosis (CMS HCC)    10. Mild intermittent asthma without complication    11. Gastroesophageal reflux disease without esophagitis    12. Chronic nausea    13. 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 Chronic bilateral low back pain with bilateral sciatica    15. Primary osteoarthritis involving multiple joints    16. 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 Chronic gout due to  renal impairment involving foot without tophus, unspecified laterality    18. Status post autologous bone marrow transplantation (CMS HCC)    19. Stem cells transplant status (CMS HCC)    20. Statin intolerance    21. Neuropathy (CMS HCC)        Diabetes  Monitor  Continue glipizide   He does not like diabetic shoes. Actually caused more foot pain.     Hypertension  Continue diuretics  Follow with nephrology/cardiology    Multiple myeloma  In remission  Follow with oncology    Mixed hyperlipidemia  Monitor  Will see if any improvement with weight loss  No other medication options except for PSK9 inhibitor but cardiology does not want him to receive    Hypothyroidism  Continue Synthroid    Nausea  Continue Zofran    Arthritis/back pain/sciatica  Continue Flexeril   Follow with Neurosurgery    Gout  Continue allopurinol     Asthma  Continue follow-up with pulmonology    Chronic edema  Continue diuretics    GERD  Continue Pepcid    Chronic disease stage III  Monitor    TIA  Follow with neurology    Restless leg  Monitor    Get labs previously ordered.  Also get Microalbumin Creatinine ratio since its due now too.     Order art gallery manager. Wehre patient wants it for outside use, not really a canddiate for electric wheelchair    Camellia Glendia Broaden, DO, [facility]  Internal Medicine

## 2024-10-11 NOTE — Nursing Note (Signed)
 Patient is here for his follow up Patient reports he has had 2 recent falls. He states he gets out of breath when walking long distances and has to sit down because his legs get weak.

## 2024-10-12 ENCOUNTER — Ambulatory Visit (INDEPENDENT_AMBULATORY_CARE_PROVIDER_SITE_OTHER): Payer: Self-pay | Admitting: INTERNAL MEDICINE

## 2024-10-12 LAB — LDL CHOLESTEROL, DIRECT: LDL DIRECT: 174 mg/dL — ABNORMAL HIGH (ref <100)

## 2024-10-13 LAB — PROTEIN, RANDOM URINE FOR ELECTROPHORESIS: PROTEIN RANDOM URINE: 10 mg/dL (ref <30)

## 2024-10-13 LAB — PROTEIN ELECTROPHORESIS, RANDOM URINE (UPEP)
MICROALBUMIN RANDOM URINE: 1.4 mg/dL (ref <2.0)
PROTEIN RANDOM URINE: 10 mg/dL (ref <30)

## 2024-10-13 LAB — HEPATITIS C ANTIBODY SCREEN WITH REFLEX TO HCV PCR: HCV ANTIBODY QUALITATIVE: NEGATIVE

## 2024-10-13 LAB — MICROALBUMIN, RANDOM URINE FOR ELECTROPHORESIS: MICROALBUMIN RANDOM URINE: 1.4 mg/dL (ref <2.0)

## 2024-10-13 LAB — HIV1/HIV2 SCREEN, COMBINED ANTIGEN AND ANTIBODY: HIV SCREEN, COMBINED ANTIGEN & ANTIBODY: NEGATIVE

## 2024-12-17 ENCOUNTER — Other Ambulatory Visit (INDEPENDENT_AMBULATORY_CARE_PROVIDER_SITE_OTHER): Payer: Self-pay | Admitting: INTERNAL MEDICINE

## 2024-12-26 ENCOUNTER — Encounter (INDEPENDENT_AMBULATORY_CARE_PROVIDER_SITE_OTHER): Payer: Self-pay

## 2024-12-27 ENCOUNTER — Ambulatory Visit (INDEPENDENT_AMBULATORY_CARE_PROVIDER_SITE_OTHER)

## 2024-12-31 ENCOUNTER — Other Ambulatory Visit (INDEPENDENT_AMBULATORY_CARE_PROVIDER_SITE_OTHER): Payer: Self-pay | Admitting: NURSE PRACTITIONER

## 2024-12-31 DIAGNOSIS — C9000 Multiple myeloma not having achieved remission: Secondary | ICD-10-CM

## 2025-01-03 ENCOUNTER — Encounter (INDEPENDENT_AMBULATORY_CARE_PROVIDER_SITE_OTHER): Payer: Self-pay

## 2025-01-03 ENCOUNTER — Other Ambulatory Visit: Payer: Self-pay

## 2025-01-03 ENCOUNTER — Ambulatory Visit (INDEPENDENT_AMBULATORY_CARE_PROVIDER_SITE_OTHER)

## 2025-01-03 DIAGNOSIS — M5442 Lumbago with sciatica, left side: Secondary | ICD-10-CM

## 2025-01-03 DIAGNOSIS — G8929 Other chronic pain: Secondary | ICD-10-CM

## 2025-01-03 DIAGNOSIS — G629 Polyneuropathy, unspecified: Secondary | ICD-10-CM

## 2025-01-03 DIAGNOSIS — M4726 Other spondylosis with radiculopathy, lumbar region: Secondary | ICD-10-CM

## 2025-01-03 DIAGNOSIS — M150 Primary generalized (osteo)arthritis: Secondary | ICD-10-CM

## 2025-01-03 DIAGNOSIS — M48061 Spinal stenosis, lumbar region without neurogenic claudication: Secondary | ICD-10-CM

## 2025-01-03 MED ORDER — DIAZEPAM 10 MG TABLET: 10.0000 mg | ORAL_TABLET | Freq: Four times a day (QID) | ORAL | 0 refills | Status: DC | PRN

## 2025-01-03 MED ORDER — DIAZEPAM 10 MG TABLET: 10.0000 mg | ORAL_TABLET | Freq: Four times a day (QID) | ORAL | 0 refills | Status: AC | PRN

## 2025-01-04 ENCOUNTER — Encounter (INDEPENDENT_AMBULATORY_CARE_PROVIDER_SITE_OTHER): Payer: Self-pay | Admitting: NURSE PRACTITIONER

## 2025-01-04 ENCOUNTER — Ambulatory Visit (INDEPENDENT_AMBULATORY_CARE_PROVIDER_SITE_OTHER)
Admission: RE | Admit: 2025-01-04 | Discharge: 2025-01-04 | Disposition: A | Source: Ambulatory Visit | Attending: NURSE PRACTITIONER

## 2025-01-04 ENCOUNTER — Ambulatory Visit: Admitting: NURSE PRACTITIONER

## 2025-01-04 ENCOUNTER — Ambulatory Visit (INDEPENDENT_AMBULATORY_CARE_PROVIDER_SITE_OTHER): Payer: Self-pay

## 2025-01-04 VITALS — BP 161/74 | HR 81 | Temp 96.9°F | Ht 71.0 in | Wt 232.2 lb

## 2025-01-04 DIAGNOSIS — C9000 Multiple myeloma not having achieved remission: Secondary | ICD-10-CM

## 2025-01-04 DIAGNOSIS — N182 Chronic kidney disease, stage 2 (mild): Secondary | ICD-10-CM

## 2025-01-04 DIAGNOSIS — G629 Polyneuropathy, unspecified: Secondary | ICD-10-CM

## 2025-01-04 DIAGNOSIS — J4520 Mild intermittent asthma, uncomplicated: Secondary | ICD-10-CM

## 2025-01-04 DIAGNOSIS — M48061 Spinal stenosis, lumbar region without neurogenic claudication: Secondary | ICD-10-CM

## 2025-01-04 DIAGNOSIS — G8929 Other chronic pain: Secondary | ICD-10-CM

## 2025-01-04 LAB — COMPREHENSIVE METABOLIC PANEL, NON-FASTING
ALBUMIN/GLOBULIN RATIO: 1.3 (ref 0.8–1.4)
ALBUMIN: 3.9 g/dL (ref 3.5–5.7)
ALKALINE PHOSPHATASE: 105 U/L — ABNORMAL HIGH (ref 34–104)
ALT (SGPT): 20 U/L (ref 7–52)
ANION GAP: 6 mmol/L (ref 4–13)
AST (SGOT): 19 U/L (ref 13–39)
BILIRUBIN TOTAL: 0.4 mg/dL (ref 0.3–1.0)
BUN/CREA RATIO: 21 (ref 6–22)
BUN: 28 mg/dL — ABNORMAL HIGH (ref 7–25)
CALCIUM, CORRECTED: 9.4 mg/dL (ref 8.9–10.8)
CALCIUM: 9.3 mg/dL (ref 8.6–10.3)
CHLORIDE: 106 mmol/L (ref 98–107)
CO2 TOTAL: 22 mmol/L (ref 21–31)
CREATININE: 1.34 mg/dL — ABNORMAL HIGH (ref 0.60–1.30)
ESTIMATED GFR: 60 mL/min/{1.73_m2} (ref >59)
GLOBULIN: 3 (ref 2.0–3.5)
GLUCOSE: 195 mg/dL — ABNORMAL HIGH (ref 74–109)
OSMOLALITY, CALCULATED: 279 mosm/kg (ref 270–290)
POTASSIUM: 4.7 mmol/L (ref 3.5–5.1)
PROTEIN TOTAL: 6.9 g/dL (ref 6.4–8.9)
SODIUM: 134 mmol/L — ABNORMAL LOW (ref 136–145)

## 2025-01-04 LAB — CBC WITH DIFF
BASOPHIL #: 0.1 10*3/uL (ref 0.00–0.10)
BASOPHIL %: 1 % (ref 0–1)
EOSINOPHIL #: 0.2 10*3/uL (ref 0.00–0.60)
EOSINOPHIL %: 2 % (ref 1–8)
HCT: 39.6 % (ref 36.7–47.1)
HGB: 13.9 g/dL (ref 12.5–16.3)
LYMPHOCYTE #: 2.4 10*3/uL (ref 1.00–3.00)
LYMPHOCYTE %: 25 % (ref 15–43)
MCH: 35.6 pg — ABNORMAL HIGH (ref 23.8–33.4)
MCHC: 35 g/dL (ref 32.5–36.3)
MCV: 101.7 fL — ABNORMAL HIGH (ref 73.0–96.2)
MONOCYTE #: 1 10*3/uL (ref 0.30–1.10)
MONOCYTE %: 10 % (ref 6–14)
MPV: 8.8 fL (ref 7.4–11.4)
NEUTROPHIL #: 6 10*3/uL (ref 1.85–7.84)
NEUTROPHIL %: 63 % (ref 44–74)
PLATELETS: 206 10*3/uL (ref 140–440)
RBC: 3.89 10*6/uL — ABNORMAL LOW (ref 4.06–5.63)
RDW: 14.2 % (ref 12.1–16.2)
WBC: 9.5 10*3/uL (ref 3.6–10.2)

## 2025-01-04 MED ORDER — DULOXETINE 30 MG CAPSULE,DELAYED RELEASE: 30.0000 mg | DELAYED_RELEASE_CAPSULE | Freq: Every day | ORAL | 3 refills | Status: AC

## 2025-01-05 ENCOUNTER — Telehealth (HOSPITAL_COMMUNITY): Payer: Self-pay

## 2025-01-05 LAB — KAPPA AND LAMBDA FREE LIGHT CHAINS, SERUM
KAPPA FREE LIGHT CHAINS: 2.7 mg/dL (ref 1.25–3.25)
KAPPA/LAMBDA FLC RATIO: 0.98 (ref 0.80–2.10)
LAMBDA FREE LIGHT CHAINS: 2.75 mg/dL — ABNORMAL HIGH (ref 0.60–2.70)

## 2025-01-05 LAB — PROTEIN ELECTROPHORESIS, RANDOM URINE (UPEP)
MICROALBUMIN RANDOM URINE: 1.2 mg/dL (ref <2.0)
PROTEIN RANDOM URINE: 8 mg/dL (ref <30)

## 2025-01-05 LAB — MONOCLONAL GAMMOPATHY PROFILE WITH IMMUNOTYPING REFLEX
ALBUMIN: 3.6 g/dL (ref 3.4–4.8)
KAPPA FREE LIGHT CHAINS: 2.7 mg/dL (ref 1.25–3.25)
KAPPA/LAMBDA FLC RATIO: 0.98 (ref 0.80–2.10)
LAMBDA FREE LIGHT CHAINS: 2.75 mg/dL — ABNORMAL HIGH (ref 0.60–2.70)
TOTAL PROTEIN: 6.4 g/dL (ref 5.6–7.6)

## 2025-01-05 LAB — PROTEIN, RANDOM URINE FOR ELECTROPHORESIS: PROTEIN RANDOM URINE: 8 mg/dL (ref <30)

## 2025-01-05 LAB — MICROALBUMIN, RANDOM URINE FOR ELECTROPHORESIS: MICROALBUMIN RANDOM URINE: 1.2 mg/dL (ref <2.0)

## 2025-01-05 LAB — ALBUMIN FOR ELECTROPHORESIS: ALBUMIN: 3.6 g/dL (ref 3.4–4.8)

## 2025-01-05 LAB — PROTEIN FOR ELECTROPHORESIS: PROTEIN TOTAL: 6.4 g/dL (ref 5.6–7.6)

## 2025-01-05 NOTE — Telephone Encounter (Signed)
 Called patient and scheduled injection for 2-10 12:00 for 12:30 with a driver.

## 2025-01-06 ENCOUNTER — Ambulatory Visit (INDEPENDENT_AMBULATORY_CARE_PROVIDER_SITE_OTHER): Payer: Self-pay

## 2025-01-11 ENCOUNTER — Encounter (HOSPITAL_COMMUNITY): Payer: Self-pay

## 2025-01-11 ENCOUNTER — Ambulatory Visit: Admit: 2025-01-11

## 2025-01-19 ENCOUNTER — Ambulatory Visit (INDEPENDENT_AMBULATORY_CARE_PROVIDER_SITE_OTHER): Payer: Self-pay | Admitting: INTERNAL MEDICINE

## 2025-01-31 ENCOUNTER — Encounter (INDEPENDENT_AMBULATORY_CARE_PROVIDER_SITE_OTHER): Payer: Self-pay

## 2025-02-06 ENCOUNTER — Ambulatory Visit

## 2025-02-25 ENCOUNTER — Encounter (INDEPENDENT_AMBULATORY_CARE_PROVIDER_SITE_OTHER): Payer: Self-pay

## 2025-04-05 ENCOUNTER — Ambulatory Visit (INDEPENDENT_AMBULATORY_CARE_PROVIDER_SITE_OTHER): Payer: Self-pay | Admitting: NURSE PRACTITIONER
